# Patient Record
Sex: Female | Born: 1970
Health system: Southern US, Community
[De-identification: ages and names within clinical notes are randomized; demographics above are authoritative.]

## PROBLEM LIST (undated history)

## (undated) DIAGNOSIS — D219 Benign neoplasm of connective and other soft tissue, unspecified: Secondary | ICD-10-CM

## (undated) DIAGNOSIS — N979 Female infertility, unspecified: Secondary | ICD-10-CM

## (undated) DIAGNOSIS — Z8489 Family history of other specified conditions: Secondary | ICD-10-CM

## (undated) DIAGNOSIS — J302 Other seasonal allergic rhinitis: Secondary | ICD-10-CM

## (undated) DIAGNOSIS — E282 Polycystic ovarian syndrome: Secondary | ICD-10-CM

## (undated) DIAGNOSIS — E039 Hypothyroidism, unspecified: Secondary | ICD-10-CM

## (undated) DIAGNOSIS — R319 Hematuria, unspecified: Secondary | ICD-10-CM

## (undated) HISTORY — PX: WISDOM TOOTH EXTRACTION: SHX21

## (undated) HISTORY — DX: Polycystic ovarian syndrome: E28.2

## (undated) HISTORY — PX: NO PAST SURGERIES: SHX2092

## (undated) HISTORY — DX: Hematuria, unspecified: R31.9

## (undated) HISTORY — DX: Other seasonal allergic rhinitis: J30.2

## (undated) HISTORY — DX: Hypothyroidism, unspecified: E03.9

## (undated) HISTORY — DX: Female infertility, unspecified: N97.9

## (undated) HISTORY — DX: Benign neoplasm of connective and other soft tissue, unspecified: D21.9

---

## 2006-04-05 ENCOUNTER — Other Ambulatory Visit: Admission: RE | Admit: 2006-04-05 | Discharge: 2006-04-05 | Payer: Self-pay | Admitting: Gynecology

## 2007-08-03 ENCOUNTER — Encounter: Payer: Self-pay | Admitting: Family Medicine

## 2007-09-25 ENCOUNTER — Encounter (INDEPENDENT_AMBULATORY_CARE_PROVIDER_SITE_OTHER): Payer: Self-pay | Admitting: *Deleted

## 2007-10-04 ENCOUNTER — Other Ambulatory Visit: Admission: RE | Admit: 2007-10-04 | Discharge: 2007-10-04 | Payer: Self-pay | Admitting: Gynecology

## 2007-11-10 ENCOUNTER — Ambulatory Visit: Payer: Self-pay | Admitting: Family Medicine

## 2007-11-10 DIAGNOSIS — E559 Vitamin D deficiency, unspecified: Secondary | ICD-10-CM | POA: Insufficient documentation

## 2007-11-10 DIAGNOSIS — J069 Acute upper respiratory infection, unspecified: Secondary | ICD-10-CM | POA: Insufficient documentation

## 2007-11-10 DIAGNOSIS — E282 Polycystic ovarian syndrome: Secondary | ICD-10-CM | POA: Insufficient documentation

## 2007-11-17 ENCOUNTER — Ambulatory Visit: Payer: Self-pay | Admitting: Family Medicine

## 2007-11-17 LAB — CONVERTED CEMR LAB
Ketones, urine, test strip: NEGATIVE
Nitrite: NEGATIVE
Protein, U semiquant: NEGATIVE
Urobilinogen, UA: NEGATIVE
WBC Urine, dipstick: NEGATIVE

## 2007-11-18 ENCOUNTER — Encounter: Payer: Self-pay | Admitting: Family Medicine

## 2007-11-21 ENCOUNTER — Encounter (INDEPENDENT_AMBULATORY_CARE_PROVIDER_SITE_OTHER): Payer: Self-pay | Admitting: *Deleted

## 2007-11-21 LAB — CONVERTED CEMR LAB
Albumin: 4 g/dL (ref 3.5–5.2)
Basophils Absolute: 0 10*3/uL (ref 0.0–0.1)
Creatinine, Ser: 0.7 mg/dL (ref 0.4–1.2)
Eosinophils Absolute: 0.1 10*3/uL (ref 0.0–0.6)
GFR calc Af Amer: 122 mL/min
HCT: 37.2 % (ref 36.0–46.0)
Hemoglobin: 12.5 g/dL (ref 12.0–15.0)
Lymphocytes Relative: 41.6 % (ref 12.0–46.0)
MCHC: 33.6 g/dL (ref 30.0–36.0)
MCV: 90.7 fL (ref 78.0–100.0)
Monocytes Absolute: 0.3 10*3/uL (ref 0.2–0.7)
Neutro Abs: 2.5 10*3/uL (ref 1.4–7.7)
Neutrophils Relative %: 50.7 % (ref 43.0–77.0)
Potassium: 3.9 meq/L (ref 3.5–5.1)
RDW: 13.1 % (ref 11.5–14.6)
Sodium: 139 meq/L (ref 135–145)
TSH: 1 microintl units/mL (ref 0.35–5.50)
Total Bilirubin: 0.9 mg/dL (ref 0.3–1.2)

## 2007-11-22 LAB — CONVERTED CEMR LAB: Vit D, 1,25-Dihydroxy: 34 (ref 30–89)

## 2009-05-23 ENCOUNTER — Ambulatory Visit: Payer: Self-pay | Admitting: Family Medicine

## 2009-05-23 DIAGNOSIS — J019 Acute sinusitis, unspecified: Secondary | ICD-10-CM

## 2009-05-23 DIAGNOSIS — R319 Hematuria, unspecified: Secondary | ICD-10-CM

## 2009-05-23 LAB — CONVERTED CEMR LAB
Ketones, urine, test strip: NEGATIVE
Nitrite: NEGATIVE
Specific Gravity, Urine: <1.005
WBC Urine, dipstick: NEGATIVE

## 2009-05-24 ENCOUNTER — Encounter: Payer: Self-pay | Admitting: Family Medicine

## 2009-05-27 ENCOUNTER — Encounter (INDEPENDENT_AMBULATORY_CARE_PROVIDER_SITE_OTHER): Payer: Self-pay | Admitting: *Deleted

## 2009-06-06 ENCOUNTER — Ambulatory Visit: Payer: Self-pay | Admitting: Family Medicine

## 2009-06-06 DIAGNOSIS — D492 Neoplasm of unspecified behavior of bone, soft tissue, and skin: Secondary | ICD-10-CM

## 2010-10-19 ENCOUNTER — Encounter: Payer: Self-pay | Admitting: Family Medicine

## 2011-02-26 LAB — HM PAP SMEAR

## 2011-11-24 ENCOUNTER — Ambulatory Visit (INDEPENDENT_AMBULATORY_CARE_PROVIDER_SITE_OTHER): Payer: BC Managed Care – PPO | Admitting: Family Medicine

## 2011-11-24 ENCOUNTER — Encounter: Payer: Self-pay | Admitting: Family Medicine

## 2011-11-24 VITALS — BP 108/70 | HR 60 | Temp 97.6°F | Ht 64.0 in | Wt 132.0 lb

## 2011-11-24 DIAGNOSIS — Z0000 Encounter for general adult medical examination without abnormal findings: Secondary | ICD-10-CM

## 2011-11-24 LAB — HEPATIC FUNCTION PANEL
ALT: 15 U/L (ref 0–35)
Albumin: 4.5 g/dL (ref 3.5–5.2)
Alkaline Phosphatase: 55 U/L (ref 39–117)
Bilirubin, Direct: 0 mg/dL (ref 0.0–0.3)
Total Protein: 7.4 g/dL (ref 6.0–8.3)

## 2011-11-24 LAB — TSH: TSH: 1 u[IU]/mL (ref 0.35–5.50)

## 2011-11-24 LAB — CBC WITH DIFFERENTIAL/PLATELET
Basophils Relative: 0.6 % (ref 0.0–3.0)
Eosinophils Absolute: 0 10*3/uL (ref 0.0–0.7)
Eosinophils Relative: 0 % (ref 0.0–5.0)
Hemoglobin: 12.1 g/dL (ref 12.0–15.0)
Lymphocytes Relative: 46.2 % — ABNORMAL HIGH (ref 12.0–46.0)
MCHC: 32.9 g/dL (ref 30.0–36.0)
MCV: 90.8 fl (ref 78.0–100.0)
Neutro Abs: 2.5 10*3/uL (ref 1.4–7.7)
Neutrophils Relative %: 47.2 % (ref 43.0–77.0)
RBC: 4.04 Mil/uL (ref 3.87–5.11)
WBC: 5.3 10*3/uL (ref 4.5–10.5)

## 2011-11-24 LAB — BASIC METABOLIC PANEL
CO2: 28 mEq/L (ref 19–32)
Calcium: 9.5 mg/dL (ref 8.4–10.5)
Chloride: 104 mEq/L (ref 96–112)
Sodium: 138 mEq/L (ref 135–145)

## 2011-11-24 LAB — LIPID PANEL: HDL: 88 mg/dL (ref >39.00)

## 2011-11-24 LAB — POCT URINALYSIS DIPSTICK
Bilirubin, UA: NEGATIVE
Glucose, UA: NEGATIVE
Ketones, UA: NEGATIVE
Leukocytes, UA: NEGATIVE
Protein, UA: NEGATIVE
Spec Grav, UA: <=1.005

## 2011-11-24 LAB — LDL CHOLESTEROL, DIRECT: Direct LDL: 109.3 mg/dL

## 2011-11-24 NOTE — Patient Instructions (Signed)

## 2011-11-24 NOTE — Progress Notes (Signed)
Addended by: Arnette Norris on: 11/24/2011 10:16 AM   Modules accepted: Orders

## 2011-11-24 NOTE — Progress Notes (Signed)
  Subjective:     Kelly Parsons is a 41 y.o. female and is here for a comprehensive physical exam. The patient reports no problems.  History   Social History  . Marital Status: Married    Spouse Name: N/A    Number of Children: 0  . Years of Education: N/A   Occupational History  . private Engineer, agricultural   . substitute teacher    Social History Main Topics  . Smoking status: Never Smoker   . Smokeless tobacco: Never Used  . Alcohol Use: No  . Drug Use: No  . Sexually Active: Yes -- Female partner(s)   Other Topics Concern  . Not on file   Social History Narrative   Exercise-- 2 days a week   Health Maintenance  Topic Date Due  . Mammogram  03/23/2012  . Influenza Vaccine  06/27/2012  . Pap Smear  02/25/2014  . Tetanus/tdap  11/09/2017    The following portions of the patient's history were reviewed and updated as appropriate: allergies, current medications, past family history, past medical history, past social history, past surgical history and problem list.  Review of Systems Review of Systems  Constitutional: Negative for activity change, appetite change and fatigue.  HENT: Negative for hearing loss, congestion, tinnitus and ear discharge.  dentist q31m Eyes: Negative for visual disturbance ( optho due- vision corrected to 20/20 with glasses).  Respiratory: Negative for cough, chest tightness and shortness of breath.   Cardiovascular: Negative for chest pain, palpitations and leg swelling.  Gastrointestinal: Negative for abdominal pain, diarrhea, constipation and abdominal distention.  Genitourinary: Negative for urgency, frequency, decreased urine volume and difficulty urinating.  Musculoskeletal: Negative for back pain, arthralgias and gait problem.  Skin: Negative for color change, pallor and rash.  Neurological: Negative for dizziness, light-headedness, numbness and headaches.  Hematological: Negative for adenopathy. Does not bruise/bleed easily.    Psychiatric/Behavioral: Negative for suicidal ideas, confusion, sleep disturbance, self-injury, dysphoric mood, decreased concentration and agitation.       Objective:    BP 108/70  Pulse 60  Temp(Src) 97.6 F (36.4 C) (Oral)  Ht 5\' 4"  (1.626 m)  Wt 132 lb (59.875 kg)  BMI 22.66 kg/m2  SpO2 99% General appearance: alert, cooperative, appears stated age and no distress Head: Normocephalic, without obvious abnormality, atraumatic Eyes: conjunctivae/corneas clear. PERRL, EOM's intact. Fundi benign. Ears: normal TM's and external ear canals both ears Nose: Nares normal. Septum midline. Mucosa normal. No drainage or sinus tenderness. Throat: lips, mucosa, and tongue normal; teeth and gums normal Neck: no adenopathy, no carotid bruit, no JVD, supple, symmetrical, trachea midline and thyroid not enlarged, symmetric, no tenderness/mass/nodules Back: symmetric, no curvature. ROM normal. No CVA tenderness. Lungs: clear to auscultation bilaterally Breasts: gyn Heart: regular rate and rhythm, S1, S2 normal, no murmur, click, rub or gallop Abdomen: soft, non-tender; bowel sounds normal; no masses,  no organomegaly Pelvic: gyn Extremities: extremities normal, atraumatic, no cyanosis or edema Pulses: 2+ and symmetric Skin: Skin color, texture, turgor normal. No rashes or lesions Lymph nodes: Cervical, supraclavicular, and axillary nodes normal. Neurologic: Alert and oriented X 3, normal strength and tone. Normal symmetric reflexes. Normal coordination and gait psych-- no depression, no anxiety    Assessment:    Healthy female exam.       Plan:    check labs  ghm utd See After Visit Summary for Counseling Recommendations

## 2012-11-11 ENCOUNTER — Other Ambulatory Visit: Payer: Self-pay

## 2013-08-02 ENCOUNTER — Other Ambulatory Visit: Payer: Self-pay

## 2014-07-12 ENCOUNTER — Other Ambulatory Visit: Payer: Self-pay

## 2014-09-27 LAB — HM MAMMOGRAPHY: HM MAMMO: NORMAL

## 2014-09-27 LAB — HM PAP SMEAR: HM Pap smear: NORMAL

## 2015-05-30 ENCOUNTER — Encounter (HOSPITAL_COMMUNITY): Payer: Self-pay | Admitting: Emergency Medicine

## 2015-05-30 ENCOUNTER — Emergency Department (HOSPITAL_COMMUNITY)
Admission: EM | Admit: 2015-05-30 | Discharge: 2015-05-30 | Disposition: A | Discharge status: Home / Self Care | Payer: 59 | Attending: Emergency Medicine | Admitting: Emergency Medicine

## 2015-05-30 ENCOUNTER — Emergency Department (HOSPITAL_COMMUNITY): Payer: 59

## 2015-05-30 DIAGNOSIS — S299XXA Unspecified injury of thorax, initial encounter: Secondary | ICD-10-CM | POA: Diagnosis present

## 2015-05-30 DIAGNOSIS — Z8639 Personal history of other endocrine, nutritional and metabolic disease: Secondary | ICD-10-CM | POA: Diagnosis not present

## 2015-05-30 DIAGNOSIS — Y998 Other external cause status: Secondary | ICD-10-CM | POA: Diagnosis not present

## 2015-05-30 DIAGNOSIS — Y9289 Other specified places as the place of occurrence of the external cause: Secondary | ICD-10-CM | POA: Diagnosis not present

## 2015-05-30 DIAGNOSIS — S6991XA Unspecified injury of right wrist, hand and finger(s), initial encounter: Secondary | ICD-10-CM | POA: Diagnosis not present

## 2015-05-30 DIAGNOSIS — Y9389 Activity, other specified: Secondary | ICD-10-CM | POA: Diagnosis not present

## 2015-05-30 DIAGNOSIS — W1789XA Other fall from one level to another, initial encounter: Secondary | ICD-10-CM | POA: Insufficient documentation

## 2015-05-30 DIAGNOSIS — R0781 Pleurodynia: Secondary | ICD-10-CM

## 2015-05-30 DIAGNOSIS — M79641 Pain in right hand: Secondary | ICD-10-CM

## 2015-05-30 MED ORDER — HYDROCODONE-ACETAMINOPHEN 5-325 MG PO TABS: 1.0000 | ORAL_TABLET | ORAL | Status: DC | PRN

## 2015-05-30 MED ORDER — ONDANSETRON 4 MG PO TBDP
4.0000 mg | ORAL_TABLET | Freq: Once | ORAL | Status: AC
  Administered 2015-05-30: 4 mg via ORAL
  Filled 2015-05-30: qty 1

## 2015-05-30 MED ORDER — OXYCODONE-ACETAMINOPHEN 5-325 MG PO TABS
2.0000 | ORAL_TABLET | Freq: Once | ORAL | Status: AC
  Administered 2015-05-30: 2 via ORAL
  Filled 2015-05-30: qty 2

## 2015-05-30 NOTE — ED Notes (Signed)
Mechanical fall from step stool this morning around 0650.   Patient states fell on R side.   Complains of R hand, R arm, R rib pain.   Patient states "I can't decide if my shoulder is hurt or not".

## 2015-05-30 NOTE — Discharge Instructions (Signed)
You were evaluated in the ED today after fall. It appears she may have suffered from a mild rib fracture. It is important for you to continue breathing deeply, coughing as needed. You'll be given pain medicines for severe pain. Continue to take Motrin or Tylenol for mild to moderate pain. Follow-up with your PCP in 10 days for reevaluation. Return to ED for worsening symptoms.

## 2015-05-30 NOTE — ED Provider Notes (Signed)
CSN: [id number]     Arrival date & time 05/30/15  0714 History   First MD Initiated Contact with Patient 05/30/15 [phone]     Chief Complaint  Patient presents with  . Fall     (Consider location/radiation/quality/duration/timing/severity/associated sxs/prior Treatment) HPI Kelly Parsons is a 44 y.o. female who comes in for evaluation after fall. Patient states approximately 7:00 this morning, she was standing on a step stool approximately 3 feet tall when she lost her balance, falling on her right side. She believes the stool may have hit her on her right ribs. She reports pain to her right ribs and right hand. She denies any head trauma, loss of consciousness, chest pain, shortness of breath, abdominal pain, nausea or vomiting, headaches, vision changes, weaknesses. She reports deep respiration exacerbates her right rib pain, leaning forward improved her discomfort. She rates her overall discomfort as a 6/10. Patient also reports that her blood pressure is usually low.  Past Medical History  Diagnosis Date  . PCOS (polycystic ovarian syndrome)    History reviewed. No pertinent past surgical history. Family History  Problem Relation Age of Onset  . Kidney disease Mother   . Kidney cancer Mother   . Cancer Mother     renal  . Colon cancer Maternal Grandfather   . Heart attack Paternal Grandfather 27  . Heart disease Paternal Grandfather 94    MI  . Hypothyroidism Father   . Hyperlipidemia Father   . Diabetes Maternal Aunt   . Diabetes Maternal Uncle   . Diabetes Maternal Grandmother   . Alcohol abuse Paternal Grandmother   . Diabetes Maternal Aunt   . Diabetes Maternal Uncle    Social History  Substance Use Topics  . Smoking status: Never Smoker   . Smokeless tobacco: Never Used  . Alcohol Use: No   OB History    No data available     Review of Systems A 10 point review of systems was completed and was negative except for pertinent positives and negatives as mentioned in  the history of present illness     Allergies  Review of patient's allergies indicates no known allergies.  Home Medications   Prior to Admission medications   Medication Sig Start Date End Date Taking? Authorizing Provider  HYDROcodone-acetaminophen (NORCO) 5-325 MG per tablet Take 1-2 tablets by mouth every 4 (four) hours as needed. 05/30/15   Comer Locket, PA-C   BP 99/47 mmHg  Pulse 64  Temp(Src) 98 F (36.7 C) (Oral)  Resp 18  SpO2 98%  LMP 05/18/2015 Physical Exam  Constitutional: She is oriented to person, place, and time. She appears well-developed and well-nourished.  HENT:  Head: Normocephalic and atraumatic.  Mouth/Throat: Oropharynx is clear and moist.  Eyes: Conjunctivae are normal. Pupils are equal, round, and reactive to light. Right eye exhibits no discharge. Left eye exhibits no discharge. No scleral icterus.  Neck: Normal range of motion. Neck supple.  Cardiovascular: Normal rate, regular rhythm and normal heart sounds.   Pulmonary/Chest: Effort normal and breath sounds normal. No respiratory distress. She has no wheezes. She has no rales.  Tenderness diffusely throughout inferior ribs along the right axillary line. No obvious bony deformities. No lesions, ecchymosis noted.  Abdominal: Soft. She exhibits no distension and no mass. There is no tenderness. There is no rebound and no guarding.  Musculoskeletal: She exhibits no tenderness.  Full active range of motion of all 4 extremities. Tenderness to palpation along the inferior aspect of right scapula. No  crepitus. Mild tenderness to palpation to lateral aspect of right fifth metacarpal and diffusely to ulnar head.  Neurological: She is alert and oriented to person, place, and time.  Cranial Nerves II-XII grossly intact. Moves all extremities without ataxia. Motor and sensation 5/5.  Skin: Skin is warm and dry. No rash noted.  Psychiatric: She has a normal mood and affect.  Nursing note and vitals  reviewed.   ED Course  Procedures (including critical care time) Labs Review Labs Reviewed - No data to display  Imaging Review Dg Chest 2 View  05/30/2015   CLINICAL DATA:  Right rib pain.  Fall.  EXAM: CHEST  2 VIEW  COMPARISON:  None.  FINDINGS: Both lungs are clear. Negative for a pneumothorax. Heart and mediastinum are within normal limits. The trachea is midline. No large pleural effusions. There is concern for mildly displaced right rib fractures involving the right third and fourth ribs.  IMPRESSION: Concern for mildly displaced right rib fractures. This could be further characterized with dedicated rib images.  Negative for pneumothorax.   Electronically Signed   By: Markus Daft M.D.   On: 05/30/2015 08:09   Dg Hand Complete Right  05/30/2015   CLINICAL DATA:  Right hand and right wrist pain status post fall.  EXAM: RIGHT HAND - COMPLETE 3+ VIEW  COMPARISON:  None.  FINDINGS: There is no evidence of fracture or dislocation. There is no evidence of arthropathy or other focal bone abnormality. Soft tissues are unremarkable. Second finger ring is noted.  IMPRESSION: No acute osseous abnormality identified.   Electronically Signed   By: Fidela Salisbury M.D.   On: 05/30/2015 08:08   I have personally reviewed and evaluated these images and lab results as part of my medical decision-making.   EKG Interpretation None     Meds given in ED:  Medications  oxyCODONE-acetaminophen (PERCOCET/ROXICET) 5-325 MG per tablet 2 tablet (2 tablets Oral Given 05/30/15 0742)  ondansetron (ZOFRAN-ODT) disintegrating tablet 4 mg (4 mg Oral Given 05/30/15 0742)    New Prescriptions   HYDROCODONE-ACETAMINOPHEN (NORCO) 5-325 MG PER TABLET    Take 1-2 tablets by mouth every 4 (four) hours as needed.   Filed Vitals:   05/30/15 0726  BP: 99/47  Pulse: 64  Temp: 98 F (36.7 C)  TempSrc: Oral  Resp: 18  SpO2: 98%    MDM  Vitals stable -afebrile Pt resting comfortably in ED. PE--normal lung exam,  normal neuro exam. Imaging--plain films of right hand negative. Chest x-ray shows concern for mildly displaced right rib fractures, no pneumothorax.  DDX--patient will be treated with pain medicines at home, given instructions on deep respirations to prevent pneumonia. Follow-up with PCP in 10 days for reevaluation. No evidence of other acute or emergent pathology at this time. Patient appears well, in good condition and is appropriate for discharge. Discharged in the care of her husband, who agrees with plan  I discussed all relevant lab findings and imaging results with pt and they verbalized understanding. Discussed f/u with PCP and return precautions, pt very amenable to plan.  Final diagnoses:  Rib pain on right side  Hand pain, right        Comer Locket, PA-C 05/30/15 [id number]  Pattricia Boss, MD 05/31/15 1214

## 2015-09-08 ENCOUNTER — Encounter: Payer: Self-pay | Admitting: Behavioral Health

## 2015-09-08 ENCOUNTER — Telehealth: Payer: Self-pay | Admitting: Behavioral Health

## 2015-09-08 NOTE — Telephone Encounter (Signed)
Pre-Visit Call completed with patient and chart updated.   Pre-Visit Info documented in Specialty Comments under [employer].    

## 2015-09-09 ENCOUNTER — Encounter: Payer: Self-pay | Admitting: Family Medicine

## 2015-09-09 ENCOUNTER — Ambulatory Visit (INDEPENDENT_AMBULATORY_CARE_PROVIDER_SITE_OTHER): Payer: 59 | Admitting: Family Medicine

## 2015-09-09 VITALS — BP 92/60 | HR 57 | Temp 98.0°F | Ht 64.75 in | Wt 126.6 lb

## 2015-09-09 DIAGNOSIS — Z0000 Encounter for general adult medical examination without abnormal findings: Secondary | ICD-10-CM

## 2015-09-09 DIAGNOSIS — E559 Vitamin D deficiency, unspecified: Secondary | ICD-10-CM

## 2015-09-09 DIAGNOSIS — R319 Hematuria, unspecified: Secondary | ICD-10-CM

## 2015-09-09 DIAGNOSIS — E032 Hypothyroidism due to medicaments and other exogenous substances: Secondary | ICD-10-CM

## 2015-09-09 LAB — CBC WITH DIFFERENTIAL/PLATELET
BASOS PCT: 0.4 % (ref 0.0–3.0)
Basophils Absolute: 0 10*3/uL (ref 0.0–0.1)
EOS ABS: 0.1 10*3/uL (ref 0.0–0.7)
EOS PCT: 1.1 % (ref 0.0–5.0)
HEMATOCRIT: 38 % (ref 36.0–46.0)
HEMOGLOBIN: 12.5 g/dL (ref 12.0–15.0)
LYMPHS PCT: 33.9 % (ref 12.0–46.0)
Lymphs Abs: 2.5 10*3/uL (ref 0.7–4.0)
MCHC: 32.9 g/dL (ref 30.0–36.0)
MCV: 92.4 fl (ref 78.0–100.0)
Monocytes Absolute: 0.3 10*3/uL (ref 0.1–1.0)
Monocytes Relative: 4.8 % (ref 3.0–12.0)
Neutro Abs: 4.3 10*3/uL (ref 1.4–7.7)
Neutrophils Relative %: 59.8 % (ref 43.0–77.0)
Platelets: 225 10*3/uL (ref 150.0–400.0)
RBC: 4.11 Mil/uL (ref 3.87–5.11)
RDW: 13 % (ref 11.5–15.5)
WBC: 7.3 10*3/uL (ref 4.0–10.5)

## 2015-09-09 LAB — VITAMIN D 25 HYDROXY (VIT D DEFICIENCY, FRACTURES): VITD: 44.05 ng/mL (ref 30.00–100.00)

## 2015-09-09 LAB — THYROID PANEL WITH TSH
Free Thyroxine Index: 2.1 (ref 1.4–3.8)
T3 Uptake: 31 % (ref 22–35)
T4 TOTAL: 6.9 ug/dL (ref 4.5–12.0)
TSH: 0.662 u[IU]/mL (ref 0.350–4.500)

## 2015-09-09 LAB — COMPREHENSIVE METABOLIC PANEL
ALBUMIN: 4.3 g/dL (ref 3.5–5.2)
ALK PHOS: 63 U/L (ref 39–117)
ALT: 13 U/L (ref 0–35)
AST: 14 U/L (ref 0–37)
BUN: 16 mg/dL (ref 6–23)
CO2: 29 mEq/L (ref 19–32)
CREATININE: 0.63 mg/dL (ref 0.40–1.20)
Calcium: 9.8 mg/dL (ref 8.4–10.5)
Chloride: 101 mEq/L (ref 96–112)
GFR: 108.81 mL/min (ref >60.00)
GLUCOSE: 91 mg/dL (ref 70–99)
Potassium: 3.8 mEq/L (ref 3.5–5.1)
SODIUM: 137 meq/L (ref 135–145)
TOTAL PROTEIN: 6.9 g/dL (ref 6.0–8.3)
Total Bilirubin: 0.5 mg/dL (ref 0.2–1.2)

## 2015-09-09 LAB — POCT URINALYSIS DIPSTICK
Bilirubin, UA: NEGATIVE
Glucose, UA: NEGATIVE
Ketones, UA: NEGATIVE
LEUKOCYTES UA: NEGATIVE
Nitrite, UA: NEGATIVE
PH UA: 6
PROTEIN UA: NEGATIVE
SPEC GRAV UA: 1.01
UROBILINOGEN UA: 0.2

## 2015-09-09 LAB — LIPID PANEL
CHOLESTEROL: 199 mg/dL (ref 0–200)
HDL: 78 mg/dL (ref >39.00)
LDL CALC: 105 mg/dL — AB (ref 0–99)
NONHDL: 120.69
Total CHOL/HDL Ratio: 3
Triglycerides: 80 mg/dL (ref 0.0–149.0)
VLDL: 16 mg/dL (ref 0.0–40.0)

## 2015-09-09 MED ORDER — THYROID 48.75 MG PO TABS: 1.0000 | ORAL_TABLET | Freq: Every morning | ORAL | Status: DC

## 2015-09-09 NOTE — Progress Notes (Signed)
Subjective:     Kelly Parsons is a 44 y.o. female and is here for a comprehensive physical exam. The patient reports no problems.  Social History   Social History  . Marital Status: Married    Spouse Name: N/A  . Number of Children: 0  . Years of Education: N/A   Occupational History  . 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 Smoking status: Never Smoker   . Smokeless tobacco: Never Used  . Alcohol Use: No  . Drug Use: No  . Sexual Activity:    Partners: Male   Other Topics Concern  . Not on file   Social History Narrative   Exercise-- 2 days a week   Health Maintenance  Topic Date Due  . INFLUENZA VACCINE  09/08/2016 (Originally 04/28/2015)  . HIV Screening  09/08/2016 (Originally [birth year]04/1986)  . MAMMOGRAM  09/28/2015  . PAP SMEAR  09/27/2017  . TETANUS/TDAP  11/09/2017    The following portions of the patient's history were reviewed and updated as appropriate:  She  has a past medical history of PCOS (polycystic ovarian syndrome); Hypothyroidism; and Hematuria. She  does not have any pertinent problems on file. She  has no past surgical history on file. Her family history includes Alcohol abuse in her paternal grandmother; Breast cancer in her mother; Colon cancer in her maternal grandfather; Diabetes in her maternal aunt, maternal aunt, maternal grandmother, maternal uncle, and maternal uncle; Heart attack (age of onset: 39) in her paternal grandfather; Heart disease (age of onset: 56) in her paternal grandfather; Hyperlipidemia in her father; Hypothyroidism in her father; Kidney cancer in her mother; Kidney disease in her mother. She  reports that she has never smoked. She has never used smokeless tobacco. She reports that she does not drink alcohol or use illicit drugs. She has a current medication list which includes the following prescription(s): thyroid. No current outpatient prescriptions on file prior to visit.   No current  facility-administered medications on file prior to visit.   She has No Known Allergies..  Review of Systems Review of Systems  Constitutional: Negative for activity change, appetite change and fatigue.  HENT: Negative for hearing loss, congestion, tinnitus and ear discharge.  dentist q28mEyes: Negative for visual disturbance (see optho q1y -- vision corrected to 20/20 with glasses).  Respiratory: Negative for cough, chest tightness and shortness of breath.   Cardiovascular: Negative for chest pain, palpitations and leg swelling.  Gastrointestinal: Negative for abdominal pain, diarrhea, constipation and abdominal distention.  Genitourinary: Negative for urgency, frequency, decreased urine volume and difficulty urinating.  Musculoskeletal: Negative for back pain, arthralgias and gait problem.  Skin: Negative for color change, pallor and rash.  Neurological: Negative for dizziness, light-headedness, numbness and headaches.  Hematological: Negative for adenopathy. Does not bruise/bleed easily.  Psychiatric/Behavioral: Negative for suicidal ideas, confusion, sleep disturbance, self-injury, dysphoric mood, decreased concentration and agitation.       Objective:    BP 92/60 mmHg  Pulse 57  Temp(Src) 98 F (36.7 C) (Oral)  Ht 5' 4.75" (1.645 m)  Wt 126 lb 9.6 oz (57.425 kg)  BMI 21.22 kg/m2  SpO2 99%  LMP 08/31/2015 General appearance: alert, cooperative, appears stated age and no distress Head: Normocephalic, without obvious abnormality, atraumatic Eyes: conjunctivae/corneas clear. PERRL, EOM's intact. Fundi benign. Ears: normal TM's and external ear canals both ears Nose: Nares normal. Septum midline. Mucosa normal. No drainage or sinus tenderness. Throat: lips, mucosa, and  tongue normal; teeth and gums normal Neck: no adenopathy, no carotid bruit, no JVD, supple, symmetrical, trachea midline and thyroid not enlarged, symmetric, no tenderness/mass/nodules Back: symmetric, no  curvature. ROM normal. No CVA tenderness. Lungs: clear to auscultation bilaterally Breasts: normal appearance, no masses or tenderness Heart: regular rate and rhythm, S1, S2 normal, no murmur, click, rub or gallop Abdomen: soft, non-tender; bowel sounds normal; no masses,  no organomegaly Pelvic: deferred -- gyn Extremities: extremities normal, atraumatic, no cyanosis or edema Pulses: 2+ and symmetric Skin: Skin color, texture, turgor normal. No rashes or lesions Lymph nodes: Cervical, supraclavicular, and axillary nodes normal. Neurologic: Alert and oriented X 3, normal strength and tone. Normal symmetric reflexes. Normal coordination and gait Psych- no depression, no anxiety      Assessment:    Healthy female exam.      Plan:    ghm utd Check labs Flu refused See After Visit Summary for Counseling Recommendations   1. Hypothyroidism due to medication   - Thyroid (NATURE-THROID) 48.75 MG TABS; Take 1 tablet by mouth every morning.  Dispense: 90 tablet; Refill: 1 - Thyroid Panel With TSH  2. Vitamin D deficiency   - Vitamin D (25 hydroxy)  3. Preventative health care   - Comp Met (CMET) - Lipid panel - CBC with Differential/Platelet - POCT urinalysis dipstick

## 2015-09-09 NOTE — Progress Notes (Signed)
Pre visit review using our clinic review tool, if applicable. No additional management support is needed unless otherwise documented below in the visit note. 

## 2015-09-09 NOTE — Patient Instructions (Signed)
Preventive Care for Adults, Female A healthy lifestyle and preventive care can promote health and wellness. Preventive health guidelines for women include the following key practices.  A routine yearly physical is a good way to check with your health care provider about your health and preventive screening. It is a chance to share any concerns and updates on your health and to receive a thorough exam.  Visit your dentist for a routine exam and preventive care every 6 months. Brush your teeth twice a day and floss once a day. Good oral hygiene prevents tooth decay and gum disease.  The frequency of eye exams is based on your age, health, family medical history, use of contact lenses, and other factors. Follow your health care provider's recommendations for frequency of eye exams.  Eat a healthy diet. Foods like vegetables, fruits, whole grains, low-fat dairy products, and lean protein foods contain the nutrients you need without too many calories. Decrease your intake of foods high in solid fats, added sugars, and salt. Eat the right amount of calories for you.Get information about a proper diet from your health care provider, if necessary.  Regular physical exercise is one of the most important things you can do for your health. Most adults should get at least 150 minutes of moderate-intensity exercise (any activity that increases your heart rate and causes you to sweat) each week. In addition, most adults need muscle-strengthening exercises on 2 or more days a week.  Maintain a healthy weight. The body mass index (BMI) is a screening tool to identify possible weight problems. It provides an estimate of body fat based on height and weight. Your health care provider can find your BMI and can help you achieve or maintain a healthy weight.For adults 20 years and older:  A BMI below 18.5 is considered underweight.  A BMI of 18.5 to 24.9 is normal.  A BMI of 25 to 29.9 is considered overweight.  A  BMI of 30 and above is considered obese.  Maintain normal blood lipids and cholesterol levels by exercising and minimizing your intake of saturated fat. Eat a balanced diet with plenty of fruit and vegetables. Blood tests for lipids and cholesterol should begin at age 45 and be repeated every 5 years. If your lipid or cholesterol levels are high, you are over 50, or you are at high risk for heart disease, you may need your cholesterol levels checked more frequently.Ongoing high lipid and cholesterol levels should be treated with medicines if diet and exercise are not working.  If you smoke, find out from your health care provider how to quit. If you do not use tobacco, do not start.  Lung cancer screening is recommended for adults aged 45-80 years who are at high risk for developing lung cancer because of a history of smoking. A yearly low-dose CT scan of the lungs is recommended for people who have at least a 30-pack-year history of smoking and are a current smoker or have quit within the past 15 years. A pack year of smoking is smoking an average of 1 pack of cigarettes a day for 1 year (for example: 1 pack a day for 30 years or 2 packs a day for 15 years). Yearly screening should continue until the smoker has stopped smoking for at least 15 years. Yearly screening should be stopped for people who develop a health problem that would prevent them from having lung cancer treatment.  If you are pregnant, do not drink alcohol. If you are  breastfeeding, be very cautious about drinking alcohol. If you are not pregnant and choose to drink alcohol, do not have more than 1 drink per day. One drink is considered to be 12 ounces (355 mL) of beer, 5 ounces (148 mL) of wine, or 1.5 ounces (44 mL) of liquor.  Avoid use of street drugs. Do not share needles with anyone. Ask for help if you need support or instructions about stopping the use of drugs.  High blood pressure causes heart disease and increases the risk  of stroke. Your blood pressure should be checked at least every 1 to 2 years. Ongoing high blood pressure should be treated with medicines if weight loss and exercise do not work.  If you are 55-79 years old, ask your health care provider if you should take aspirin to prevent strokes.  Diabetes screening is done by taking a blood sample to check your blood glucose level after you have not eaten for a certain period of time (fasting). If you are not overweight and you do not have risk factors for diabetes, you should be screened once every 3 years starting at age 45. If you are overweight or obese and you are 40-70 years of age, you should be screened for diabetes every year as part of your cardiovascular risk assessment.  Breast cancer screening is essential preventive care for women. You should practice "breast self-awareness." This means understanding the normal appearance and feel of your breasts and may include breast self-examination. Any changes detected, no matter how small, should be reported to a health care provider. Women in their 20s and 30s should have a clinical breast exam (CBE) by a health care provider as part of a regular health exam every 1 to 3 years. After age 40, women should have a CBE every year. Starting at age 40, women should consider having a mammogram (breast X-ray test) every year. Women who have a family history of breast cancer should talk to their health care provider about genetic screening. Women at a high risk of breast cancer should talk to their health care providers about having an MRI and a mammogram every year.  Breast cancer gene (BRCA)-related cancer risk assessment is recommended for women who have family members with BRCA-related cancers. BRCA-related cancers include breast, ovarian, tubal, and peritoneal cancers. Having family members with these cancers may be associated with an increased risk for harmful changes (mutations) in the breast cancer genes BRCA1 and  BRCA2. Results of the assessment will determine the need for genetic counseling and BRCA1 and BRCA2 testing.  Your health care provider may recommend that you be screened regularly for cancer of the pelvic organs (ovaries, uterus, and vagina). This screening involves a pelvic examination, including checking for microscopic changes to the surface of your cervix (Pap test). You may be encouraged to have this screening done every 3 years, beginning at age 21.  For women ages 30-65, health care providers may recommend pelvic exams and Pap testing every 3 years, or they may recommend the Pap and pelvic exam, combined with testing for human papilloma virus (HPV), every 5 years. Some types of HPV increase your risk of cervical cancer. Testing for HPV may also be done on women of any age with unclear Pap test results.  Other health care providers may not recommend any screening for nonpregnant women who are considered low risk for pelvic cancer and who do not have symptoms. Ask your health care provider if a screening pelvic exam is right for   you.  If you have had past treatment for cervical cancer or a condition that could lead to cancer, you need Pap tests and screening for cancer for at least 20 years after your treatment. If Pap tests have been discontinued, your risk factors (such as having a new sexual partner) need to be reassessed to determine if screening should resume. Some women have medical problems that increase the chance of getting cervical cancer. In these cases, your health care provider may recommend more frequent screening and Pap tests.  Colorectal cancer can be detected and often prevented. Most routine colorectal cancer screening begins at the age of 50 years and continues through age 75 years. However, your health care provider may recommend screening at an earlier age if you have risk factors for colon cancer. On a yearly basis, your health care provider may provide home test kits to check  for hidden blood in the stool. Use of a small camera at the end of a tube, to directly examine the colon (sigmoidoscopy or colonoscopy), can detect the earliest forms of colorectal cancer. Talk to your health care provider about this at age 50, when routine screening begins. Direct exam of the colon should be repeated every 5-10 years through age 75 years, unless early forms of precancerous polyps or small growths are found.  People who are at an increased risk for hepatitis B should be screened for this virus. You are considered at high risk for hepatitis B if:  You were born in a country where hepatitis B occurs often. Talk with your health care provider about which countries are considered high risk.  Your parents were born in a high-risk country and you have not received a shot to protect against hepatitis B (hepatitis B vaccine).  You have HIV or AIDS.  You use needles to inject street drugs.  You live with, or have sex with, someone who has hepatitis B.  You get hemodialysis treatment.  You take certain medicines for conditions like cancer, organ transplantation, and autoimmune conditions.  Hepatitis C blood testing is recommended for all people born from 1945 through 1965 and any individual with known risks for hepatitis C.  Practice safe sex. Use condoms and avoid high-risk sexual practices to reduce the spread of sexually transmitted infections (STIs). STIs include gonorrhea, chlamydia, syphilis, trichomonas, herpes, HPV, and human immunodeficiency virus (HIV). Herpes, HIV, and HPV are viral illnesses that have no cure. They can result in disability, cancer, and death.  You should be screened for sexually transmitted illnesses (STIs) including gonorrhea and chlamydia if:  You are sexually active and are younger than 24 years.  You are older than 24 years and your health care provider tells you that you are at risk for this type of infection.  Your sexual activity has changed  since you were last screened and you are at an increased risk for chlamydia or gonorrhea. Ask your health care provider if you are at risk.  If you are at risk of being infected with HIV, it is recommended that you take a prescription medicine daily to prevent HIV infection. This is called preexposure prophylaxis (PrEP). You are considered at risk if:  You are sexually active and do not regularly use condoms or know the HIV status of your partner(s).  You take drugs by injection.  You are sexually active with a partner who has HIV.  Talk with your health care provider about whether you are at high risk of being infected with HIV. If   you choose to begin PrEP, you should first be tested for HIV. You should then be tested every 3 months for as long as you are taking PrEP.  Osteoporosis is a disease in which the bones lose minerals and strength with aging. This can result in serious bone fractures or breaks. The risk of osteoporosis can be identified using a bone density scan. Women ages 67 years and over and women at risk for fractures or osteoporosis should discuss screening with their health care providers. Ask your health care provider whether you should take a calcium supplement or vitamin D to reduce the rate of osteoporosis.  Menopause can be associated with physical symptoms and risks. Hormone replacement therapy is available to decrease symptoms and risks. You should talk to your health care provider about whether hormone replacement therapy is right for you.  Use sunscreen. Apply sunscreen liberally and repeatedly throughout the day. You should seek shade when your shadow is shorter than you. Protect yourself by wearing long sleeves, pants, a wide-brimmed hat, and sunglasses year round, whenever you are outdoors.  Once a month, do a whole body skin exam, using a mirror to look at the skin on your back. Tell your health care provider of new moles, moles that have irregular borders, moles that  are larger than a pencil eraser, or moles that have changed in shape or color.  Stay current with required vaccines (immunizations).  Influenza vaccine. All adults should be immunized every year.  Tetanus, diphtheria, and acellular pertussis (Td, Tdap) vaccine. Pregnant women should receive 1 dose of Tdap vaccine during each pregnancy. The dose should be obtained regardless of the length of time since the last dose. Immunization is preferred during the 27th-36th week of gestation. An adult who has not previously received Tdap or who does not know her vaccine status should receive 1 dose of Tdap. This initial dose should be followed by tetanus and diphtheria toxoids (Td) booster doses every 10 years. Adults with an unknown or incomplete history of completing a 3-dose immunization series with Td-containing vaccines should begin or complete a primary immunization series including a Tdap dose. Adults should receive a Td booster every 10 years.  Varicella vaccine. An adult without evidence of immunity to varicella should receive 2 doses or a second dose if she has previously received 1 dose. Pregnant females who do not have evidence of immunity should receive the first dose after pregnancy. This first dose should be obtained before leaving the health care facility. The second dose should be obtained 4-8 weeks after the first dose.  Human papillomavirus (HPV) vaccine. Females aged 13-26 years who have not received the vaccine previously should obtain the 3-dose series. The vaccine is not recommended for use in pregnant females. However, pregnancy testing is not needed before receiving a dose. If a female is found to be pregnant after receiving a dose, no treatment is needed. In that case, the remaining doses should be delayed until after the pregnancy. Immunization is recommended for any person with an immunocompromised condition through the age of 61 years if she did not get any or all doses earlier. During the  3-dose series, the second dose should be obtained 4-8 weeks after the first dose. The third dose should be obtained 24 weeks after the first dose and 16 weeks after the second dose.  Zoster vaccine. One dose is recommended for adults aged 30 years or older unless certain conditions are present.  Measles, mumps, and rubella (MMR) vaccine. Adults born  before 1957 generally are considered immune to measles and mumps. Adults born in 1957 or later should have 1 or more doses of MMR vaccine unless there is a contraindication to the vaccine or there is laboratory evidence of immunity to each of the three diseases. A routine second dose of MMR vaccine should be obtained at least 28 days after the first dose for students attending postsecondary schools, health care workers, or international travelers. People who received inactivated measles vaccine or an unknown type of measles vaccine during 1963-1967 should receive 2 doses of MMR vaccine. People who received inactivated mumps vaccine or an unknown type of mumps vaccine before 1979 and are at high risk for mumps infection should consider immunization with 2 doses of MMR vaccine. For females of childbearing age, rubella immunity should be determined. If there is no evidence of immunity, females who are not pregnant should be vaccinated. If there is no evidence of immunity, females who are pregnant should delay immunization until after pregnancy. Unvaccinated health care workers born before 1957 who lack laboratory evidence of measles, mumps, or rubella immunity or laboratory confirmation of disease should consider measles and mumps immunization with 2 doses of MMR vaccine or rubella immunization with 1 dose of MMR vaccine.  Pneumococcal 13-valent conjugate (PCV13) vaccine. When indicated, a person who is uncertain of his immunization history and has no record of immunization should receive the PCV13 vaccine. All adults 65 years of age and older should receive this  vaccine. An adult aged 19 years or older who has certain medical conditions and has not been previously immunized should receive 1 dose of PCV13 vaccine. This PCV13 should be followed with a dose of pneumococcal polysaccharide (PPSV23) vaccine. Adults who are at high risk for pneumococcal disease should obtain the PPSV23 vaccine at least 8 weeks after the dose of PCV13 vaccine. Adults older than 44 years of age who have normal immune system function should obtain the PPSV23 vaccine dose at least 1 year after the dose of PCV13 vaccine.  Pneumococcal polysaccharide (PPSV23) vaccine. When PCV13 is also indicated, PCV13 should be obtained first. All adults aged 65 years and older should be immunized. An adult younger than age 65 years who has certain medical conditions should be immunized. Any person who resides in a nursing home or long-term care facility should be immunized. An adult smoker should be immunized. People with an immunocompromised condition and certain other conditions should receive both PCV13 and PPSV23 vaccines. People with human immunodeficiency virus (HIV) infection should be immunized as soon as possible after diagnosis. Immunization during chemotherapy or radiation therapy should be avoided. Routine use of PPSV23 vaccine is not recommended for American Indians, [place] Natives, or people younger than 65 years unless there are medical conditions that require PPSV23 vaccine. When indicated, people who have unknown immunization and have no record of immunization should receive PPSV23 vaccine. One-time revaccination 5 years after the first dose of PPSV23 is recommended for people aged 19-64 years who have chronic kidney failure, nephrotic syndrome, asplenia, or immunocompromised conditions. People who received 1-2 doses of PPSV23 before age 65 years should receive another dose of PPSV23 vaccine at age 65 years or later if at least 5 years have passed since the previous dose. Doses of PPSV23 are not  needed for people immunized with PPSV23 at or after age 65 years.  Meningococcal vaccine. Adults with asplenia or persistent complement component deficiencies should receive 2 doses of quadrivalent meningococcal conjugate (MenACWY-D) vaccine. The doses should be obtained   at least 2 months apart. Microbiologists working with certain meningococcal bacteria, [place] recruits, people at risk during an outbreak, and people who travel to or live in countries with a high rate of meningitis should be immunized. A first-year college student up through age 34 years who is living in a residence hall should receive a dose if she did not receive a dose on or after her 16th birthday. Adults who have certain high-risk conditions should receive one or more doses of vaccine.  Hepatitis A vaccine. Adults who wish to be protected from this disease, have certain high-risk conditions, work with hepatitis A-infected animals, work in hepatitis A research labs, or travel to or work in countries with a high rate of hepatitis A should be immunized. Adults who were previously unvaccinated and who anticipate close contact with an international adoptee during the first 60 days after arrival in the [place] from a country with a high rate of hepatitis A should be immunized.  Hepatitis B vaccine. Adults who wish to be protected from this disease, have certain high-risk conditions, may be exposed to blood or other infectious body fluids, are household contacts or sex partners of hepatitis B positive people, are clients or workers in certain care facilities, or travel to or work in countries with a high rate of hepatitis B should be immunized.  Haemophilus influenzae type b (Hib) vaccine. A previously unvaccinated person with asplenia or sickle cell disease or having a scheduled splenectomy should receive 1 dose of Hib vaccine. Regardless of previous immunization, a recipient of a hematopoietic stem cell transplant should receive a  3-dose series 6-12 months after her successful transplant. Hib vaccine is not recommended for adults with HIV infection. Preventive Services / Frequency Ages 35 to 4 years  Blood pressure check.** / Every 3-5 years.  Lipid and cholesterol check.** / Every 5 years beginning at age 60.  Clinical breast exam.** / Every 3 years for women in their 71s and 10s.  BRCA-related cancer risk assessment.** / For women who have family members with a BRCA-related cancer (breast, ovarian, tubal, or peritoneal cancers).  Pap test.** / Every 2 years from ages 76 through 26. Every 3 years starting at age 61 through age 76 or 93 with a history of 3 consecutive normal Pap tests.  HPV screening.** / Every 3 years from ages 37 through ages 60 to 51 with a history of 3 consecutive normal Pap tests.  Hepatitis C blood test.** / For any individual with known risks for hepatitis C.  Skin self-exam. / Monthly.  Influenza vaccine. / Every year.  Tetanus, diphtheria, and acellular pertussis (Tdap, Td) vaccine.** / Consult your health care provider. Pregnant women should receive 1 dose of Tdap vaccine during each pregnancy. 1 dose of Td every 10 years.  Varicella vaccine.** / Consult your health care provider. Pregnant females who do not have evidence of immunity should receive the first dose after pregnancy.  HPV vaccine. / 3 doses over 6 months, if 93 and younger. The vaccine is not recommended for use in pregnant females. However, pregnancy testing is not needed before receiving a dose.  Measles, mumps, rubella (MMR) vaccine.** / You need at least 1 dose of MMR if you were born in 1957 or later. You may also need a 2nd dose. For females of childbearing age, rubella immunity should be determined. If there is no evidence of immunity, females who are not pregnant should be vaccinated. If there is no evidence of immunity, females who are  pregnant should delay immunization until after pregnancy.  Pneumococcal  13-valent conjugate (PCV13) vaccine.** / Consult your health care provider.  Pneumococcal polysaccharide (PPSV23) vaccine.** / 1 to 2 doses if you smoke cigarettes or if you have certain conditions.  Meningococcal vaccine.** / 1 dose if you are age 68 to 8 years and a Market researcher living in a residence hall, or have one of several medical conditions, you need to get vaccinated against meningococcal disease. You may also need additional booster doses.  Hepatitis A vaccine.** / Consult your health care provider.  Hepatitis B vaccine.** / Consult your health care provider.  Haemophilus influenzae type b (Hib) vaccine.** / Consult your health care provider. Ages 7 to 53 years  Blood pressure check.** / Every year.  Lipid and cholesterol check.** / Every 5 years beginning at age 25 years.  Lung cancer screening. / Every year if you are aged 11-80 years and have a 30-pack-year history of smoking and currently smoke or have quit within the past 15 years. Yearly screening is stopped once you have quit smoking for at least 15 years or develop a health problem that would prevent you from having lung cancer treatment.  Clinical breast exam.** / Every year after age 48 years.  BRCA-related cancer risk assessment.** / For women who have family members with a BRCA-related cancer (breast, ovarian, tubal, or peritoneal cancers).  Mammogram.** / Every year beginning at age 41 years and continuing for as long as you are in good health. Consult with your health care provider.  Pap test.** / Every 3 years starting at age 65 years through age 37 or 70 years with a history of 3 consecutive normal Pap tests.  HPV screening.** / Every 3 years from ages 72 years through ages 60 to 40 years with a history of 3 consecutive normal Pap tests.  Fecal occult blood test (FOBT) of stool. / Every year beginning at age 21 years and continuing until age 5 years. You may not need to do this test if you get  a colonoscopy every 10 years.  Flexible sigmoidoscopy or colonoscopy.** / Every 5 years for a flexible sigmoidoscopy or every 10 years for a colonoscopy beginning at age 35 years and continuing until age 48 years.  Hepatitis C blood test.** / For all people born from [birth year] through 1965 and any individual with known risks for hepatitis C.  Skin self-exam. / Monthly.  Influenza vaccine. / Every year.  Tetanus, diphtheria, and acellular pertussis (Tdap/Td) vaccine.** / Consult your health care provider. Pregnant women should receive 1 dose of Tdap vaccine during each pregnancy. 1 dose of Td every 10 years.  Varicella vaccine.** / Consult your health care provider. Pregnant females who do not have evidence of immunity should receive the first dose after pregnancy.  Zoster vaccine.** / 1 dose for adults aged 30 years or older.  Measles, mumps, rubella (MMR) vaccine.** / You need at least 1 dose of MMR if you were born in 1957 or later. You may also need a second dose. For females of childbearing age, rubella immunity should be determined. If there is no evidence of immunity, females who are not pregnant should be vaccinated. If there is no evidence of immunity, females who are pregnant should delay immunization until after pregnancy.  Pneumococcal 13-valent conjugate (PCV13) vaccine.** / Consult your health care provider.  Pneumococcal polysaccharide (PPSV23) vaccine.** / 1 to 2 doses if you smoke cigarettes or if you have certain conditions.  Meningococcal vaccine.** /  Consult your health care provider.  Hepatitis A vaccine.** / Consult your health care provider.  Hepatitis B vaccine.** / Consult your health care provider.  Haemophilus influenzae type b (Hib) vaccine.** / Consult your health care provider. Ages 64 years and over  Blood pressure check.** / Every year.  Lipid and cholesterol check.** / Every 5 years beginning at age 23 years.  Lung cancer screening. / Every year if you  are aged 16-80 years and have a 30-pack-year history of smoking and currently smoke or have quit within the past 15 years. Yearly screening is stopped once you have quit smoking for at least 15 years or develop a health problem that would prevent you from having lung cancer treatment.  Clinical breast exam.** / Every year after age 74 years.  BRCA-related cancer risk assessment.** / For women who have family members with a BRCA-related cancer (breast, ovarian, tubal, or peritoneal cancers).  Mammogram.** / Every year beginning at age 44 years and continuing for as long as you are in good health. Consult with your health care provider.  Pap test.** / Every 3 years starting at age 58 years through age 22 or 39 years with 3 consecutive normal Pap tests. Testing can be stopped between 65 and 70 years with 3 consecutive normal Pap tests and no abnormal Pap or HPV tests in the past 10 years.  HPV screening.** / Every 3 years from ages 64 years through ages 70 or 61 years with a history of 3 consecutive normal Pap tests. Testing can be stopped between 65 and 70 years with 3 consecutive normal Pap tests and no abnormal Pap or HPV tests in the past 10 years.  Fecal occult blood test (FOBT) of stool. / Every year beginning at age 40 years and continuing until age 27 years. You may not need to do this test if you get a colonoscopy every 10 years.  Flexible sigmoidoscopy or colonoscopy.** / Every 5 years for a flexible sigmoidoscopy or every 10 years for a colonoscopy beginning at age 7 years and continuing until age 32 years.  Hepatitis C blood test.** / For all people born from [birth year] through 1965 and any individual with known risks for hepatitis C.  Osteoporosis screening.** / A one-time screening for women ages 30 years and over and women at risk for fractures or osteoporosis.  Skin self-exam. / Monthly.  Influenza vaccine. / Every year.  Tetanus, diphtheria, and acellular pertussis (Tdap/Td)  vaccine.** / 1 dose of Td every 10 years.  Varicella vaccine.** / Consult your health care provider.  Zoster vaccine.** / 1 dose for adults aged 35 years or older.  Pneumococcal 13-valent conjugate (PCV13) vaccine.** / Consult your health care provider.  Pneumococcal polysaccharide (PPSV23) vaccine.** / 1 dose for all adults aged 46 years and older.  Meningococcal vaccine.** / Consult your health care provider.  Hepatitis A vaccine.** / Consult your health care provider.  Hepatitis B vaccine.** / Consult your health care provider.  Haemophilus influenzae type b (Hib) vaccine.** / Consult your health care provider. ** Family history and personal history of risk and conditions may change your health care provider's recommendations.   This information is not intended to replace advice given to you by your health care provider. Make sure you discuss any questions you have with your health care provider.   Document Released: 11/09/2001 Document Revised: 10/04/2014 Document Reviewed: 02/08/2011 Elsevier Interactive Patient Education [employer].

## 2015-09-09 NOTE — Addendum Note (Signed)
Addended by: Caffie Pinto on: 09/09/2015 02:38 PM   Modules accepted: Orders

## 2015-09-11 LAB — URINE CULTURE
COLONY COUNT: NO GROWTH
Organism ID, Bacteria: NO GROWTH

## 2015-10-22 ENCOUNTER — Telehealth: Payer: Self-pay | Admitting: Family Medicine

## 2015-10-22 NOTE — Telephone Encounter (Signed)
Kelly Parsons with [facility]  Pt called in questioning bill she received for 1 lab. Per Larwance Sachs the primary DX listed on the Vit D was deficiency rather than preventative testing. If it is recoded insurance will cover. Please have corrected and resubmitted. Please f/u with pt when resubbed.

## 2016-03-26 ENCOUNTER — Other Ambulatory Visit: Payer: Self-pay | Admitting: Emergency Medicine

## 2016-03-26 DIAGNOSIS — E032 Hypothyroidism due to medicaments and other exogenous substances: Secondary | ICD-10-CM

## 2016-03-26 MED ORDER — THYROID 48.75 MG PO TABS: 1.0000 | ORAL_TABLET | Freq: Every morning | ORAL | Status: DC

## 2016-08-12 ENCOUNTER — Telehealth: Payer: Self-pay | Admitting: Family Medicine

## 2016-08-12 NOTE — Telephone Encounter (Signed)
Caller name:Julie-Anne Nutting Relationship to patient: Can be reached:[phone] Pharmacy:  Reason for call:Requesting to know which GYN dr at [facility] that you recommended? She wants to call and schedule appt. Please advise.

## 2016-08-12 NOTE — Telephone Encounter (Signed)
DR Karoline Caldwell or [facility]

## 2016-08-13 NOTE — Telephone Encounter (Signed)
She would like to know the one you see there.

## 2016-08-13 NOTE — Telephone Encounter (Signed)
Pt aware.

## 2016-08-13 NOTE — Telephone Encounter (Signed)
grewal--- I don't think she is taking any new pt though

## 2016-08-25 ENCOUNTER — Telehealth: Payer: Self-pay | Admitting: Family Medicine

## 2016-08-25 NOTE — Telephone Encounter (Signed)
Caller name: Relationship to patient: Self Can be reached: [phone] Pharmacy:  Reason for call: Patient request call back to discuss Thyroid Medication

## 2016-08-26 NOTE — Telephone Encounter (Signed)
Spoke with pt, pt states that she is currently taking Nature-Throid which is a generic brand and the pharmacy stated their having manufacturing issues and can not get the medication. Pt stated she tried several different pharmacies and they're also having the same issue. Pt states pharmacist recommend trying NT Thyroid or Armor but if she is going to switch she should check her Thyroid level. Pt also states that she doesn't want to try Synthroid because her father is currently taking this medication and he is having so many side effect. Pt states she only have 3 days left of medication. Please advise. LB

## 2016-08-30 ENCOUNTER — Other Ambulatory Visit: Payer: Self-pay

## 2016-08-30 MED ORDER — THYROID 30 MG PO TABS: 30.0000 mg | ORAL_TABLET | Freq: Every day | ORAL | 1 refills | Status: AC

## 2016-08-30 NOTE — Telephone Encounter (Signed)
Ok to try armour thyroid 30 mg daily She needs labs done in 2 months-- due for cpe as well

## 2016-08-30 NOTE — Telephone Encounter (Signed)
Medication oredered. Patient notified.

## 2016-10-20 DIAGNOSIS — Z0000 Encounter for general adult medical examination without abnormal findings: Secondary | ICD-10-CM | POA: Diagnosis not present

## 2016-10-27 DIAGNOSIS — Z1389 Encounter for screening for other disorder: Secondary | ICD-10-CM | POA: Diagnosis not present

## 2016-10-27 DIAGNOSIS — E038 Other specified hypothyroidism: Secondary | ICD-10-CM | POA: Diagnosis not present

## 2016-10-27 DIAGNOSIS — Z0000 Encounter for general adult medical examination without abnormal findings: Secondary | ICD-10-CM | POA: Diagnosis not present

## 2016-10-27 DIAGNOSIS — R7301 Impaired fasting glucose: Secondary | ICD-10-CM | POA: Diagnosis not present

## 2016-10-27 DIAGNOSIS — E559 Vitamin D deficiency, unspecified: Secondary | ICD-10-CM | POA: Diagnosis not present

## 2017-11-07 DIAGNOSIS — R7301 Impaired fasting glucose: Secondary | ICD-10-CM | POA: Diagnosis not present

## 2017-11-07 DIAGNOSIS — E038 Other specified hypothyroidism: Secondary | ICD-10-CM | POA: Diagnosis not present

## 2017-11-07 DIAGNOSIS — E559 Vitamin D deficiency, unspecified: Secondary | ICD-10-CM | POA: Diagnosis not present

## 2017-11-07 DIAGNOSIS — R82998 Other abnormal findings in urine: Secondary | ICD-10-CM | POA: Diagnosis not present

## 2017-11-15 DIAGNOSIS — E038 Other specified hypothyroidism: Secondary | ICD-10-CM | POA: Diagnosis not present

## 2017-11-15 DIAGNOSIS — Z1389 Encounter for screening for other disorder: Secondary | ICD-10-CM | POA: Diagnosis not present

## 2017-11-15 DIAGNOSIS — R7301 Impaired fasting glucose: Secondary | ICD-10-CM | POA: Diagnosis not present

## 2017-11-15 DIAGNOSIS — Z0000 Encounter for general adult medical examination without abnormal findings: Secondary | ICD-10-CM | POA: Diagnosis not present

## 2017-11-15 DIAGNOSIS — E7849 Other hyperlipidemia: Secondary | ICD-10-CM | POA: Diagnosis not present

## 2017-11-17 DIAGNOSIS — Z1212 Encounter for screening for malignant neoplasm of rectum: Secondary | ICD-10-CM | POA: Diagnosis not present

## 2017-12-13 ENCOUNTER — Encounter: Payer: Self-pay | Admitting: Internal Medicine

## 2018-02-14 DIAGNOSIS — Z1211 Encounter for screening for malignant neoplasm of colon: Secondary | ICD-10-CM | POA: Diagnosis not present

## 2018-06-15 ENCOUNTER — Other Ambulatory Visit: Payer: Self-pay

## 2018-06-15 ENCOUNTER — Ambulatory Visit (INDEPENDENT_AMBULATORY_CARE_PROVIDER_SITE_OTHER): Payer: 59 | Admitting: Obstetrics & Gynecology

## 2018-06-15 ENCOUNTER — Telehealth: Payer: Self-pay | Admitting: Obstetrics & Gynecology

## 2018-06-15 ENCOUNTER — Other Ambulatory Visit (HOSPITAL_COMMUNITY)
Admission: RE | Admit: 2018-06-15 | Discharge: 2018-06-15 | Disposition: A | Discharge status: Home / Self Care | Payer: 59 | Source: Ambulatory Visit | Attending: Obstetrics & Gynecology | Admitting: Obstetrics & Gynecology

## 2018-06-15 ENCOUNTER — Encounter: Payer: Self-pay | Admitting: Obstetrics & Gynecology

## 2018-06-15 VITALS — BP 94/60 | HR 68 | Resp 16 | Ht 64.25 in | Wt 136.0 lb

## 2018-06-15 DIAGNOSIS — Z124 Encounter for screening for malignant neoplasm of cervix: Secondary | ICD-10-CM | POA: Insufficient documentation

## 2018-06-15 DIAGNOSIS — R3129 Other microscopic hematuria: Secondary | ICD-10-CM | POA: Insufficient documentation

## 2018-06-15 DIAGNOSIS — N951 Menopausal and female climacteric states: Secondary | ICD-10-CM | POA: Diagnosis not present

## 2018-06-15 DIAGNOSIS — Z01419 Encounter for gynecological examination (general) (routine) without abnormal findings: Secondary | ICD-10-CM | POA: Diagnosis not present

## 2018-06-15 DIAGNOSIS — Z0000 Encounter for general adult medical examination without abnormal findings: Secondary | ICD-10-CM

## 2018-06-15 DIAGNOSIS — R311 Benign essential microscopic hematuria: Secondary | ICD-10-CM

## 2018-06-15 DIAGNOSIS — Z1211 Encounter for screening for malignant neoplasm of colon: Secondary | ICD-10-CM

## 2018-06-15 LAB — POCT URINALYSIS DIPSTICK
BILIRUBIN UA: NEGATIVE
GLUCOSE UA: NEGATIVE
Ketones, UA: NEGATIVE
LEUKOCYTES UA: NEGATIVE
Nitrite, UA: NEGATIVE
PH UA: 6 (ref 5.0–8.0)
Protein, UA: NEGATIVE
UROBILINOGEN UA: 0.2 U/dL

## 2018-06-15 NOTE — Telephone Encounter (Signed)
As we do all Paps with the liquid based technology, proximity to menstrual cycle or being on her menstrual cycle does not negatively impact the Pap smear.  Thank her for letting us know but it should be fine.  Thanks.

## 2018-06-15 NOTE — Addendum Note (Signed)
Addended by: Polly Cobia on: 06/15/2018 10:41 AM   Modules accepted: Orders

## 2018-06-15 NOTE — Progress Notes (Signed)
47 y.o. G0P0000 Married Other or two or more races female here for new patient annual exam.  Changed practices due to changes in insurance.  Saw Dr. Carren Rang for years.  Did have an ultrasound in 2016.    Having some insomnia issues.  Gets to sleep without any issues.  She wakes up after five hours, no matter what times she does to sleep.    Cycles are still fairly regular but have changed in the past few months.  Does have a little clotting from time to time.  Last month, cycle came in 3 weeks.    Has hx of infertility.  Stopped OCPs at 23.  Never got pregnancy.  Does have hx of PCOs.    Mother with hx of renal cancer x 2 and breast cancer.  Her genetic testing was negative.    Did have fecal occult testing with Dr. Brigitte Pulse.    Patient's last menstrual period was 05/18/2018.          Sexually active: Yes.    The current method of family planning is none.    Exercising: Yes.    walk/ trampoline  Smoker:  no  Health Maintenance: Pap:  09/2014 Normal  History of abnormal Pap:  Yes, 2015. Repeat normal  MMG:  10/29/14 Normal  Colonoscopy:  Never.  Has been referred for screening colonoscopy.   BMD:   Never TDaP:  ~ 3 years ago Pneumonia vaccine(s):  n/a Shingrix:   n/a Hep C testing: n/a Screening Labs: TSH   UA: RBC= 2+  (saw Dr. Jeffie Pollock)   reports that she has never smoked. She has never used smokeless tobacco. She reports that she does not drink alcohol or use drugs.  Past Medical History:  Diagnosis Date  . Fibroid   . Hematuria   . Hypothyroidism   . Infertility, female   . PCOS (polycystic ovarian syndrome)     History reviewed. No pertinent surgical history.  Current Outpatient Medications  Medication Sig Dispense Refill  . Multiple Vitamin (MULTIVITAMIN) tablet Take 1 tablet by mouth daily.    . Omega-3 Fatty Acids (FISH OIL) 1000 MG CAPS Take by mouth.    . thyroid (ARMOUR THYROID) 30 MG tablet Take 1 tablet (30 mg total) by mouth daily before breakfast. 30 tablet 1  .  VITAMIN D-VITAMIN K PO Take 5,000 Units by mouth every other day.     No current facility-administered medications for this visit.     Family History  Problem Relation Age of Onset  . Kidney disease Mother   . Kidney cancer Mother 53       recurrent age 37  . Breast cancer Mother 73  . Heart attack Paternal Grandfather 18  . Heart disease Paternal Grandfather 55       MI  . Hypothyroidism Father   . Diabetes Maternal Aunt   . Diabetes Maternal Uncle   . Diabetes Maternal Grandmother   . Alcohol abuse Paternal Grandmother   . Diabetes Maternal Aunt   . Diabetes Maternal Uncle   . Colon cancer Maternal Grandfather     Review of Systems  All other systems reviewed and are negative.   Exam:   BP 94/60 (BP Location: Right Arm, Patient Position: Sitting, Cuff Size: Normal)   Pulse 68   Resp 16   Ht 5' 4.25" (1.632 m)   Wt 136 lb (61.7 kg)   LMP 05/18/2018   BMI 23.16 kg/m   Height:   Height: 5' 4.25" (163.2  cm)  Ht Readings from Last 3 Encounters:  06/15/18 5' 4.25" (1.632 m)  09/09/15 5' 4.75" (1.645 m)  11/24/11 5\' 4"  (1.626 m)    General appearance: alert, cooperative and appears stated age Head: Normocephalic, without obvious abnormality, atraumatic Neck: no adenopathy, supple, symmetrical, trachea midline and thyroid normal to inspection and palpation Lungs: clear to auscultation bilaterally Breasts: normal appearance, no masses or tenderness Heart: regular rate and rhythm Abdomen: soft, non-tender; bowel sounds normal; no masses,  no organomegaly Extremities: extremities normal, atraumatic, no cyanosis or edema Skin: Skin color, texture, turgor normal. No rashes or lesions Lymph nodes: Cervical, supraclavicular, and axillary nodes normal. No abnormal inguinal nodes palpated Neurologic: Grossly normal   Pelvic: External genitalia:  no lesions              Urethra:  normal appearing urethra with no masses, tenderness or lesions              Bartholins and  Skenes: normal                 Vagina: normal appearing vagina with normal color and discharge, no lesions              Cervix: no lesions              Pap taken: Yes.   Bimanual Exam:  Uterus:  enlarged, 8 weeks with broadness at fundus weeks size              Adnexa: normal adnexa and no mass, fullness, tenderness               Rectovaginal: Confirms               Anus:  normal sphincter tone, no lesions  Chaperone was present for exam.  A:  Well Woman with normal exam Enlarged uterus with hx of fibroids PCOS hx H/O elevated cholesterol that has normalized with diet and exercise H/O breast and renal cell carcinoma with her mother H/o microscopic hematuria with negative evaluation with Dr. Jeffie Pollock Hypothyroidism  P:   Mammogram guidelines reviewed.  Recommended doing 3D MMG. pap smear with HR HPV obtained today FSH, estradiol and progesterone obtained today Did fecal occult blood testing with Dr. Brigitte Pulse.  American Cancer society guidelines for screening colonoscopy reviewed.  She has declined doing colonoscopy this year. Return annually or prn

## 2018-06-15 NOTE — Telephone Encounter (Signed)
Patient was seen today and "just started her cycle'. She is wondering if this interfere with her pap result?

## 2018-06-15 NOTE — Telephone Encounter (Signed)
Spoke with patient. Patient wanted to make Dr. Sabra Heck aware her menses started after OV today. Patient wanted to make sure pap would not need to be repeated. Advised I will update Dr. Sabra Heck, will return call to office if any additional instructions.   Dr. Sabra Heck -ok to proceed with pap collected today?

## 2018-06-16 LAB — ESTRADIOL: ESTRADIOL: 24.8 pg/mL

## 2018-06-16 LAB — FOLLICLE STIMULATING HORMONE: FSH: 4.8 m[IU]/mL

## 2018-06-16 LAB — TSH: TSH: 1.39 u[IU]/mL (ref 0.450–4.500)

## 2018-06-16 LAB — PROGESTERONE: Progesterone: 0.7 ng/mL

## 2018-06-16 NOTE — Telephone Encounter (Signed)
Left message to call Magdaline Zollars at [phone].  

## 2018-06-19 LAB — CYTOLOGY - PAP
Diagnosis: NEGATIVE
HPV: NOT DETECTED

## 2018-06-19 NOTE — Telephone Encounter (Signed)
Left message to call Jesly Hartmann at [phone].  

## 2018-06-21 NOTE — Telephone Encounter (Signed)
Patient has not returned call.   Routing to Dr. Miller.  Encounter closed.  

## 2018-06-22 ENCOUNTER — Telehealth: Payer: Self-pay | Admitting: *Deleted

## 2018-06-22 NOTE — Telephone Encounter (Signed)
Written by Megan Salon, MD on 06/21/2018 3:04 PM  Kelly Parsons,  I hope you are ok with me sending you the results via Epic. My office tried to call you back to answer your pap smear question and then give tests results. So, if you have any questions--please don't hesitate to send me a message or call the office.   First, your pap smear was normal and the high risk HPV testing was negative. This is good.  Second, your thyroid testing was normal.  Lastly, you FSH (follicle stimulating hormone) was normal so this does not look like perimenopause. However, both the estrogen and progesterone levels were low (so perimenopause). Dyssynchronous results are very common in perimenopause. So, I think you would benefit from some progesterone or even possibly estrogen and progesterone together. I'm guessing you probably don't want to start any estrogen at this time, so I would recommend nightly progesterone with 100-200mg  prometrium. This can make you sleepy, so it would be taken nightly.    Please let me know your thoughts/questions/concerns. Thank you.   Kelly Parsons

## 2018-06-22 NOTE — Telephone Encounter (Signed)
Patient calling for results. Advised as seen below per Dr. Sabra Heck. Patient states she has not set up MyChart yet  Patient declines Rx at this time, would like to do some research on Prometrium, will return call to office to advise or with any additional questions.   Routing to Dr. Lestine Box.   Encounter closed.

## 2019-10-10 ENCOUNTER — Other Ambulatory Visit: Payer: Self-pay | Admitting: Obstetrics & Gynecology

## 2019-10-10 DIAGNOSIS — Z1231 Encounter for screening mammogram for malignant neoplasm of breast: Secondary | ICD-10-CM

## 2019-10-18 ENCOUNTER — Ambulatory Visit (INDEPENDENT_AMBULATORY_CARE_PROVIDER_SITE_OTHER): Payer: 59 | Admitting: Obstetrics & Gynecology

## 2019-10-18 ENCOUNTER — Telehealth: Payer: Self-pay | Admitting: Obstetrics & Gynecology

## 2019-10-18 ENCOUNTER — Encounter: Payer: Self-pay | Admitting: Obstetrics & Gynecology

## 2019-10-18 ENCOUNTER — Other Ambulatory Visit: Payer: Self-pay

## 2019-10-18 VITALS — BP 100/60 | HR 68 | Temp 97.9°F | Resp 10 | Ht 64.0 in | Wt 137.0 lb

## 2019-10-18 DIAGNOSIS — Z23 Encounter for immunization: Secondary | ICD-10-CM | POA: Diagnosis not present

## 2019-10-18 DIAGNOSIS — N898 Other specified noninflammatory disorders of vagina: Secondary | ICD-10-CM | POA: Diagnosis not present

## 2019-10-18 DIAGNOSIS — N920 Excessive and frequent menstruation with regular cycle: Secondary | ICD-10-CM | POA: Diagnosis not present

## 2019-10-18 DIAGNOSIS — N951 Menopausal and female climacteric states: Secondary | ICD-10-CM

## 2019-10-18 DIAGNOSIS — Z01419 Encounter for gynecological examination (general) (routine) without abnormal findings: Secondary | ICD-10-CM | POA: Diagnosis not present

## 2019-10-18 MED ORDER — NORETHINDRONE 0.35 MG PO TABS: 1.0000 | ORAL_TABLET | Freq: Every day | ORAL | 3 refills | Status: DC

## 2019-10-18 NOTE — Progress Notes (Signed)
49 y.o. G0P0000 Married Other or two or more races female here for annual exam.  Patient complains of having heavier periods and extreme fatigue before starting her period.   Cycles are now about every 3 to 3 1/2 weeks.  Feels she is having increased pain on the right side with her cycles.  Naproxyn helped and this resolved.  Flow is heavier and she is wearing a super tampon and an overnight pad.  She has also noted a lot of fatigue right before her cycle.  Also has noted some insomnia about the week before.  This isn't too bothersome.    PCP:  Dr. Brigitte Pulse.  Had blood work in 2020.    Patient's last menstrual period was 09/29/2019.          Sexually active: Yes.    The current method of family planning is none.    Exercising: No.  The patient does not participate in regular exercise at present. Smoker:  no  Health Maintenance: Pap:   06/15/18 Neg:Neg HR HPV  09/2014 Normal  History of abnormal Pap:  Yes, 2015. Repeat was normal MMG:  10/29/14 Normal -- Scheduled 11/19/19 Colonoscopy:  Never.  Saw GI who recommended this at 37.   BMD:   n/a TDaP:  10/2007 Pneumonia vaccine(s):  n/a Shingrix:   no Hep C testing: n/a Screening Labs: PCP   reports that she has never smoked. She has never used smokeless tobacco. She reports that she does not drink alcohol or use drugs.  Past Medical History:  Diagnosis Date  . Fibroid   . Hematuria   . Hypothyroidism   . Infertility, female   . PCOS (polycystic ovarian syndrome)     History reviewed. No pertinent surgical history.  Current Outpatient Medications  Medication Sig Dispense Refill  . Chaste Tree (VITEX EXTRACT PO) Take by mouth.    . Multiple Vitamin (MULTIVITAMIN) tablet Take 1 tablet by mouth daily.    . Omega-3 Fatty Acids (FISH OIL) 1000 MG CAPS Take by mouth.    . Probiotic Product (PROBIOTIC-10 PO) Take by mouth.    . thyroid (ARMOUR THYROID) 30 MG tablet Take 1 tablet (30 mg total) by mouth daily before breakfast. 30 tablet 1  .  VITAMIN D-VITAMIN K PO Take 5,000 Units by mouth every other day.     No current facility-administered medications for this visit.    Family History  Problem Relation Age of Onset  . Kidney disease Mother   . Kidney cancer Mother 52       recurrent age 14  . Breast cancer Mother 53  . Heart attack Paternal Grandfather 58  . Heart disease Paternal Grandfather 77       MI  . Hypothyroidism Father   . Diabetes Maternal Aunt   . Diabetes Maternal Uncle   . Diabetes Maternal Grandmother   . Alcohol abuse Paternal Grandmother   . Diabetes Maternal Aunt   . Diabetes Maternal Uncle   . Colon cancer Maternal Grandfather     Review of Systems  Genitourinary:       Menstrual cycle changes   All other systems reviewed and are negative.   Exam:   BP 100/60 (BP Location: Right Arm, Patient Position: Sitting, Cuff Size: Normal)   Pulse 68   Temp 97.9 F (36.6 C) (Temporal)   Resp 10   Ht 5\' 4"  (1.626 m)   Wt 137 lb (62.1 kg)   LMP 09/29/2019   BMI 23.52 kg/m  Height: 5\' 4"  (162.6 cm)  Ht Readings from Last 3 Encounters:  10/18/19 5\' 4"  (1.626 m)  06/15/18 5' 4.25" (1.632 m)  09/09/15 5' 4.75" (1.645 m)   General appearance: alert, cooperative and appears stated age Head: Normocephalic, without obvious abnormality, atraumatic Neck: no adenopathy, supple, symmetrical, trachea midline and thyroid normal to inspection and palpation Lungs: clear to auscultation bilaterally Breasts: normal appearance, no masses or tenderness Heart: regular rate and rhythm Abdomen: soft, non-tender; bowel sounds normal; no masses,  no organomegaly Extremities: extremities normal, atraumatic, no cyanosis or edema Skin: Skin color, texture, turgor normal. No rashes or lesions Lymph nodes: Cervical, supraclavicular, and axillary nodes normal. No abnormal inguinal nodes palpated Neurologic: Grossly normal   Pelvic: External genitalia:  no lesions              Urethra:  normal appearing urethra  with no masses, tenderness or lesions              Bartholins and Skenes: normal                 Vagina: normal appearing vagina with normal color and discharge, no lesions              Cervix: no lesions              Pap taken: No. Bimanual Exam:  Uterus:  enlarged, 8 weeks size              Adnexa: normal adnexa and no mass, fullness, tenderness               Rectovaginal: Confirms               Anus:  normal sphincter tone, no lesions  Chaperone, Terence Lux, CMA, was present for exam.  A:  Well Woman with normal exam Menorrhagia with h/o fibroids H/o PCOS Family hx of RCC and breast cancer in mother H/o microscopic hematuria with negative evaluation with Dr. Jeffie Pollock Hypothyroidism, followed by Dr. Brigitte Pulse  P:   Mammogram guidelines reviewd.   pap smear with neg HR HPV obtained last year.  Not indicated today. Iron levels, CBC, FSH and progesterone obtained today Pt will return for PUS to assess change in bleeding Tdap will be update today Saw GI in 2020 who recommended colonoscopy at age 13. She will start micronor for bleeding.  Rx to pharmacy.  Will plan recheck after ultrasound is completed. Return annually or prn

## 2019-10-18 NOTE — Telephone Encounter (Signed)
Patient returned call. Reviewed benefit for recommended ultrasound. Patient acknowledges understanding. Patient is scheduled 10/25/2019 with Dr Sabra Heck. Patient is aware of appointment date, arrival time and cancellation policy. wil close encounter

## 2019-10-18 NOTE — Telephone Encounter (Signed)
Call placed to patient to review benefit for recommended ultrasound. Left voicemail message requesting a return call

## 2019-10-20 LAB — PROGESTERONE: Progesterone: 6.4 ng/mL

## 2019-10-20 LAB — IRON,TIBC AND FERRITIN PANEL
Ferritin: 21 ng/mL (ref 15–150)
Iron Saturation: 18 % (ref 15–55)
Iron: 68 ug/dL (ref 27–159)
Total Iron Binding Capacity: 385 ug/dL (ref 250–450)
UIBC: 317 ug/dL (ref 131–425)

## 2019-10-20 LAB — CBC
Hematocrit: 38 % (ref 34.0–46.6)
Hemoglobin: 12.3 g/dL (ref 11.1–15.9)
MCH: 29.3 pg (ref 26.6–33.0)
MCHC: 32.4 g/dL (ref 31.5–35.7)
MCV: 91 fL (ref 79–97)
Platelets: 218 10*3/uL (ref 150–450)
RBC: 4.2 x10E6/uL (ref 3.77–5.28)
RDW: 12.2 % (ref 11.7–15.4)
WBC: 5.7 10*3/uL (ref 3.4–10.8)

## 2019-10-20 LAB — FOLLICLE STIMULATING HORMONE: FSH: 4.1 m[IU]/mL

## 2019-10-22 ENCOUNTER — Telehealth: Payer: Self-pay | Admitting: Obstetrics & Gynecology

## 2019-10-22 NOTE — Telephone Encounter (Signed)
Spoke with patient. Patient asking if recommended PUS is necessary? Patient states pelvic pain resolved and no changes in size of uterus during pelvic exam.   Advised patient PUS was recommended for further evaluation of menorrhagia and enlarged uterus.   Patient did not start Micronor, states she was under the impression she would wait for labs to return.   Advised patient I will review plan of care with Dr. Sabra Heck and f/u, patient agreeable.     Dr. Sabra Heck -please review lab results dated 10/18/19 and advise on PUS.

## 2019-10-22 NOTE — Telephone Encounter (Signed)
Patient called requesting to cancel ultrasound appointment on 10/25/2019. Patient states she is no longer having symptoms. Patient is asking since she is not having symptoms, doe sshe need to reschedule?  Routing to Triage Nurse for reivew

## 2019-10-22 NOTE — Telephone Encounter (Signed)
Please let pt know her progesterone level and FSH are normal.  She is not menopausal yet.  Her CBC was normal and iron levels were normal.  She's been experiencing a lot of fatigue prior to her cycles and I wanted to check for iron deficiency and anemia.  She does not have either one.  She can start the micronor now or can wait until after the ultrasound.  I still do recommend proceeding with that evaluation as well.  Thanks.

## 2019-10-23 NOTE — Telephone Encounter (Signed)
Spoke with patient, advised per Dr. Sabra Heck. Patient request to proceed with PUS. PUS scheduled for 11/01/19 at 12:30pm, consult at 1pm with Dr. Sabra Heck. Questions answered.    Routing to provider for final review. Patient is agreeable to disposition. Will close encounter.  Cc: Lerry Liner, Magdalene Patricia

## 2019-10-25 ENCOUNTER — Other Ambulatory Visit: Payer: 59

## 2019-10-25 ENCOUNTER — Other Ambulatory Visit: Payer: 59 | Admitting: Obstetrics & Gynecology

## 2019-10-30 ENCOUNTER — Other Ambulatory Visit: Payer: Self-pay

## 2019-11-01 ENCOUNTER — Ambulatory Visit (INDEPENDENT_AMBULATORY_CARE_PROVIDER_SITE_OTHER): Payer: 59

## 2019-11-01 ENCOUNTER — Ambulatory Visit (INDEPENDENT_AMBULATORY_CARE_PROVIDER_SITE_OTHER): Payer: 59 | Admitting: Obstetrics & Gynecology

## 2019-11-01 ENCOUNTER — Other Ambulatory Visit: Payer: Self-pay

## 2019-11-01 ENCOUNTER — Encounter: Payer: Self-pay | Admitting: Obstetrics & Gynecology

## 2019-11-01 VITALS — BP 100/52 | HR 76 | Temp 97.3°F | Ht 64.0 in | Wt 138.2 lb

## 2019-11-01 DIAGNOSIS — N9489 Other specified conditions associated with female genital organs and menstrual cycle: Secondary | ICD-10-CM

## 2019-11-01 DIAGNOSIS — N920 Excessive and frequent menstruation with regular cycle: Secondary | ICD-10-CM

## 2019-11-01 DIAGNOSIS — D251 Intramural leiomyoma of uterus: Secondary | ICD-10-CM

## 2019-11-01 DIAGNOSIS — N838 Other noninflammatory disorders of ovary, fallopian tube and broad ligament: Secondary | ICD-10-CM | POA: Diagnosis not present

## 2019-11-01 DIAGNOSIS — D270 Benign neoplasm of right ovary: Secondary | ICD-10-CM

## 2019-11-01 NOTE — Progress Notes (Signed)
49 y.o. G0P0000 Married Other or two or more races female here for pelvic ultrasound due to menorrhagia that she reported at her annual exam on 10/18/2019.  Patient reports that her most recent recent cycle the start on January 28 was actually normal at only about 4 days.  However the one before that was much heavier she had significant amount of fatigue before cycle.  She is experiencing increased right lower quadrant pain with her menstrual cycle as well.  She is taking Naprosyn at times.  She is wearing a super tampon and an overnight pad when the flow is heaviest.  Patient's last menstrual period was 10/25/2019.  Contraception: None  Findings:  UTERUS: 8.9 x 7.7 x 6.8 cm with several fibroids.  The largest is 4.  6 x 3.9 cm.  She also has a 4.7 x 3.3 cm fibroid.  An additional 3.1 x 2.6 and 0.9 x 0.7 cm fibroid is noted as well. EMS: 5.9 mm with a 9 mm echogenic focus with a vascular feeder, possible endometrial polyp  ADNEXA: Left ovary: 3.0 x 1.8 x 1.8 cm with a 1.6 cm follicle noted       Right ovary: 3.0 x 2.1 x 1.7 cm with a 2.2 x 1.7 1.6 cm calcified mass possible fibroma.  It is avascular. CUL DE SAC: No free fluid noted.  Discussion: Images reviewed with patient.  On her exam her uterus is really not enlarged and the measurements of the uterus are fairly normal however there are 2 sizable fibroids present.  We discussed the nature of fibroids as she has many questions.  She is aware these have no malignant potential.  She is also aware these are likely what is causing her change in menstrual cycles.  The solid ovarian finding was discussed with possible etiologies.  The endometrial lesion was discussed as well.  Treatment for fibroids discussed including OCPs, progesterone methods, endometrial ablation, myomectomy, and hysterectomy.  Treatment for the polyp discussed as well as for the ovarian mass.  These would typically include surgical excision and for the ovarian mass could also include  conservative follow-up with serial ultrasounds.  Patient would prefer to proceed with excision of the ovarian mass and for the endometrial polyp.  However she would prefer not to do anything with the fibroids at this time.  We did test for anemia and iron deficiency in January at her annual exam and there was no anemia or iron deficiency noted.  She feels that she is tolerating the bleeding well now that she knows the cause, she will not worry as much about it we discussed timing of procedure.  She has some upcoming visits from friends and would like to postpone until early April.  I recommended a CA-125 today.  We will plan follow-up ultrasound prior to the surgery depending on dates she ultimately chooses.  Assessment: Menorrhagia Fibroid uterus Endometrial polyp Right ovarian mass calcified with shadowing Family history of endometriosis in her sister  Plan: CA-125 is obtained today. We will proceed with scheduling hysteroscopy with polyp resection and laparoscopic RSO.  Patient is aware that I might find endometriosis at the time of her surgery given her family history.  So consent form will also contain possible treatment of endometriosis.  After reviewing findings including pictures with pt, an additional 30 minutes spent with pt discussing findings and answering questions about causes of above findings, treatment options, conservative follow up and timing.  She had many questions and these are all addressed individually.

## 2019-11-02 LAB — CA 125: Cancer Antigen (CA) 125: 8.4 U/mL (ref 0.0–38.1)

## 2019-11-19 ENCOUNTER — Ambulatory Visit: Payer: 59

## 2019-12-24 ENCOUNTER — Telehealth: Payer: Self-pay

## 2019-12-24 NOTE — Telephone Encounter (Signed)
-----   Message from Megan Salon, MD sent at 12/23/2019 11:01 PM EDT ----- Patient has endometrial polyp that we discussed removal of when I saw her about a month ago.  She wanted to wait until April to make a plan for procedure.  She has an ovarian mass that I may remove at the same time as well. Can you touch base with her and see what she is thinking?  Thanks.

## 2019-12-24 NOTE — Telephone Encounter (Signed)
Left message to call Rhianna Raulerson at [phone]. 

## 2019-12-25 ENCOUNTER — Ambulatory Visit
Admission: RE | Admit: 2019-12-25 | Discharge: 2019-12-25 | Disposition: A | Discharge status: Home / Self Care | Payer: 59 | Source: Ambulatory Visit | Attending: Obstetrics & Gynecology | Admitting: Obstetrics & Gynecology

## 2019-12-25 ENCOUNTER — Other Ambulatory Visit: Payer: Self-pay | Admitting: Obstetrics & Gynecology

## 2019-12-25 ENCOUNTER — Other Ambulatory Visit: Payer: Self-pay

## 2019-12-25 DIAGNOSIS — Z1231 Encounter for screening mammogram for malignant neoplasm of breast: Secondary | ICD-10-CM

## 2019-12-26 ENCOUNTER — Other Ambulatory Visit: Payer: Self-pay | Admitting: Obstetrics & Gynecology

## 2019-12-26 DIAGNOSIS — R928 Other abnormal and inconclusive findings on diagnostic imaging of breast: Secondary | ICD-10-CM

## 2019-12-27 NOTE — Telephone Encounter (Signed)
Spoke with patient. Patient has questions regarding surgery.  1. How "pressing" is the time frame? States she can do early May if it needs to be done ASAP, would prefer to wait until June or July.  2.  Since mass is calcified does this mean it will not get any larger? Does that make it safer to postpone?

## 2019-12-31 ENCOUNTER — Other Ambulatory Visit: Payer: Self-pay

## 2019-12-31 ENCOUNTER — Ambulatory Visit
Admission: RE | Admit: 2019-12-31 | Discharge: 2019-12-31 | Disposition: A | Discharge status: Home / Self Care | Payer: 59 | Source: Ambulatory Visit | Attending: Obstetrics & Gynecology | Admitting: Obstetrics & Gynecology

## 2019-12-31 DIAGNOSIS — R928 Other abnormal and inconclusive findings on diagnostic imaging of breast: Secondary | ICD-10-CM

## 2020-01-31 NOTE — Telephone Encounter (Signed)
Could you touch base with pt again?  Can just do hysteroscopy and polyp resection and follow the ovary with ultrasounds as well with the knowledge that if this changes, surgery would need to be done at that time.  She should repeat the PUS and check ovary prior to surgery.

## 2020-02-04 NOTE — Telephone Encounter (Signed)
Left message to call Ramia Sidney at [phone]. 

## 2020-02-13 NOTE — Telephone Encounter (Signed)
Left message to call Tunis Gentle at [phone]. 

## 2020-02-14 NOTE — Telephone Encounter (Signed)
Dr.Miller I have been unable to reach this patient. Please advise.

## 2020-03-24 NOTE — Telephone Encounter (Signed)
Letter to [place] for review upon return to the office.

## 2020-03-24 NOTE — Telephone Encounter (Signed)
I think we should send her a letter stating we've tried to reach her about scheduling her procedure and cannot reach her.  If she desires to proceed, she should please call us.  I think that's enough.

## 2020-04-02 NOTE — Telephone Encounter (Signed)
Letter sent to patient's home address on file. Encounter closed.

## 2020-04-09 ENCOUNTER — Telehealth: Payer: Self-pay | Admitting: Obstetrics & Gynecology

## 2020-04-09 NOTE — Telephone Encounter (Signed)
Patient stated she is returning a call [name]. No open telephone note but letter in chart Kelly Parsons trying to reach patient.

## 2020-04-11 NOTE — Telephone Encounter (Signed)
Spoke with patient regarding surgery options as seen below from [place]. Patient would like to have a repeat ultrasound before deciding on how to proceed. PUS scheduled for 04/29/2020 at 3:30 pm with 4 pm consult with Dr.Miller. Patient is agreeable to date and time.   Megan Salon, MD to Me      2:37 PM Note Could you touch base with pt again?  Can just do hysteroscopy and polyp resection and follow the ovary with ultrasounds as well with the knowledge that if this changes, surgery would need to be done at that time.  She should repeat the PUS and check ovary prior to surgery.       Routing to provider and will close encounter.

## 2020-04-29 ENCOUNTER — Other Ambulatory Visit: Payer: Self-pay

## 2020-04-29 ENCOUNTER — Ambulatory Visit (INDEPENDENT_AMBULATORY_CARE_PROVIDER_SITE_OTHER): Payer: 59 | Admitting: Obstetrics & Gynecology

## 2020-04-29 ENCOUNTER — Encounter: Payer: Self-pay | Admitting: Obstetrics & Gynecology

## 2020-04-29 ENCOUNTER — Ambulatory Visit (INDEPENDENT_AMBULATORY_CARE_PROVIDER_SITE_OTHER): Payer: 59

## 2020-04-29 VITALS — BP 100/60 | HR 68 | Resp 16 | Wt 131.0 lb

## 2020-04-29 DIAGNOSIS — N838 Other noninflammatory disorders of ovary, fallopian tube and broad ligament: Secondary | ICD-10-CM | POA: Diagnosis not present

## 2020-04-29 DIAGNOSIS — D251 Intramural leiomyoma of uterus: Secondary | ICD-10-CM

## 2020-04-29 DIAGNOSIS — D270 Benign neoplasm of right ovary: Secondary | ICD-10-CM

## 2020-04-29 DIAGNOSIS — N9489 Other specified conditions associated with female genital organs and menstrual cycle: Secondary | ICD-10-CM | POA: Diagnosis not present

## 2020-04-29 NOTE — Progress Notes (Signed)
49 y.o. G0P0000 Married Other or two or more races female here for pelvic ultrasound due to h/o menorrhagia, uterine fibroids, endometrial mass, and ovarian solid tumor.  Ultrasound was done in February and oophorectomy with hysteroscopy/polyp resection was discussed.  Pt has postponed this until now and desired repeat PUS.  She is accompanied by her husband today.  Ca-125 11/01/2019 was 8.4.  Patient's last menstrual period was 04/14/2020.  Contraception: none  Findings:  UTERUS: 10.1 x 7.3 x 6.7cm with 4.8 x 4.0cm fibroid, 3.8 x 2.8cm fibroid, 1.8 x 1.7cm fibroid and 1cm fibroid EMS: 5.39mm with 6mm echogenic foci with feeder vessel ADNEXA: Left ovary: 2.3 x 1.7 x 1.5cm        Right ovary: 4.0 x 2. X 2.2cm with solid mass 16 x 17mm (feeder vessel noted into mass) CUL DE SAC:  No free fluid  Discussion:  Ultrasonographer supervised.  Findings discussed with pt and spouse.  Solid finding on ovary still present as well as endometrial mass. Pt and spouse aware if probable polyp removed, she will likely not have a change in her bleeding.  Hysteroscopic resection and RSO discussed.  Recovery and pain management discussed.  Risks discussed including but not limited to bleeding, rare risk of transfusion, infection, 1% risk of uterine perforation with risks of fluid deficit causing cardiac arrythmia, cerebral swelling and/or need to stop procedure early.  Fluid emboli and rare risk of death discussed.  DVT/PE, rare risk of risk of bowel/bladder/ureteral/vascular injury.  Patient aware if pathology abnormal she may need additional treatment.  All questions answered.  She is going to consider this vs continued observation.  Assessment:  Endometrial mass and 78mm solid right ovarian lesion with feeder vessel    Plan:  Hysteroscopic polyp resection, D&C, Laparoscopic RSO will be planned.    After review of ultrasound images and comparison to prior ultrasound, additional 35 minutes spent in total with pt and  spouse

## 2020-05-05 ENCOUNTER — Telehealth: Payer: Self-pay | Admitting: Obstetrics & Gynecology

## 2020-05-05 NOTE — Telephone Encounter (Signed)
Spoke with patient regarding surgery benefits. Patient acknowledges understanding of information presented. Patient is aware that benefits presented are for professional benefits only. Patient is aware that once surgery is scheduled, the hospital will call with separate benefits. Patient is aware of surgery cancellation policy.  Patient stated that she is looking to proceed with surgery at the end of October. Patient to call back after speaking with [facility] to inquire about hospital benefits prior to scheduling.

## 2020-05-16 NOTE — Telephone Encounter (Signed)
Left message to call Chuckie Mccathern at [phone]. 

## 2020-05-20 NOTE — Telephone Encounter (Signed)
Patient is returning call to [facility]. Patient states she would like to schedule at surgery at the end of October.

## 2020-05-23 NOTE — Telephone Encounter (Signed)
Returned call to patient. Left message calling to discuss options for surgery dates in October.  Can call back to [place] or [place].

## 2020-05-26 ENCOUNTER — Telehealth: Payer: Self-pay

## 2020-05-26 ENCOUNTER — Encounter: Payer: Self-pay | Admitting: Obstetrics & Gynecology

## 2020-05-26 NOTE — Telephone Encounter (Signed)
Pt sent following mychart message:   Orland Jarred, MD 7 hours ago (9:59 AM)   I am sorry we have been playing voice mail and messaging phone tag.  I am ready to get surgery scheduled for the removal of the uterine polyp and the tumor on my right ovary per Dr Ammie Ferrier reccommendation.  Would Oct 25/26 be available? The week after is also good for me.   Ideally, I'd like to schedule the surgery early enough to where I could do my follow up visit with Dr. Sabra Heck before her responsibilities at another practice begin.  I'm available today on  my cell before 11:30 am and after 1 pm. I'm available tomorrow before 3pm.  Let's try to connect again this week.  Thanks, [place]

## 2020-05-26 NOTE — Telephone Encounter (Signed)
Patient is returning call.  °

## 2020-05-26 NOTE — Telephone Encounter (Signed)
Routing to [name], RN °

## 2020-05-27 NOTE — Telephone Encounter (Signed)
Spoke with patient. Patient spoke with her family and is rearranging plans. Can proceed with surgery on 07/15/2020. Hysteroscopy D&C Myosure polyp resection with lap RSO scheduled for 07/15/2020 at 0730 at [facility]. Pre op scheduled for 06/26/2020 at 11:30 am. COVID test scheduled for 07/11/2020 at 3 pm at [facility] location. Patient is aware of the need to quarantine after test until surgery. 2 week post op scheduled for 07/29/2020 at 8:30 am with Dr.Miller. Surgery instructions reviewed. Patient verbalizes understanding. Surgery packet to Joy to provide to patient at her pre op.  Routing to provider and will close encounter.

## 2020-05-27 NOTE — Telephone Encounter (Signed)
Please see telephone encounter dated 05/05/2020. Encounter closed.

## 2020-05-27 NOTE — Telephone Encounter (Signed)
Spoke with patient. Discussed latest date that Dr.Miller is going to be performing this surgery will be 07/15/2020. Patient states that every weekend in September and October she is traveling or has family coming into town and will not be able to quarantine after COVID test until surgery. Patient can only do this on 10/25-10/26. Asking if she may do so and follow up with another MD in office for post op or if it is okay for her to wait until later in the year to have this with Dr.Miller.

## 2020-06-26 ENCOUNTER — Encounter: Payer: Self-pay | Admitting: Obstetrics & Gynecology

## 2020-06-26 ENCOUNTER — Ambulatory Visit (INDEPENDENT_AMBULATORY_CARE_PROVIDER_SITE_OTHER): Payer: 59 | Admitting: Obstetrics & Gynecology

## 2020-06-26 ENCOUNTER — Other Ambulatory Visit: Payer: Self-pay

## 2020-06-26 ENCOUNTER — Other Ambulatory Visit: Payer: Self-pay | Admitting: Obstetrics & Gynecology

## 2020-06-26 VITALS — BP 104/72 | HR 68 | Resp 16 | Wt 134.0 lb

## 2020-06-26 DIAGNOSIS — N920 Excessive and frequent menstruation with regular cycle: Secondary | ICD-10-CM

## 2020-06-26 DIAGNOSIS — N9489 Other specified conditions associated with female genital organs and menstrual cycle: Secondary | ICD-10-CM

## 2020-06-26 DIAGNOSIS — D251 Intramural leiomyoma of uterus: Secondary | ICD-10-CM

## 2020-06-26 DIAGNOSIS — N838 Other noninflammatory disorders of ovary, fallopian tube and broad ligament: Secondary | ICD-10-CM

## 2020-06-26 NOTE — Progress Notes (Signed)
49 y.o. G0P0000 Married Other or two or more races female here for discussion of upcoming procedure.  Laparoscopic RSO with hysteroscopy, polyp resection, D&C planned due to solid right adnexal mass and endometrial polyp.  Pre-op evaluation thus far has included ultrasound on 04/29/2020.  Patient does have known fibroids.  She also has a echogenic focus within the endometrium and feeder vessel consistent with a polyp.  The right ovary has a 16 x 12 mm solid mass with a feeder vessel into the mass.  These were present on imaging done in February.  We discussed possible procedure at that time.  Patient postponed until now.  Ca 125 in February was 8.4.  Procedure discussed with patient.  Hospital stay, recovery and pain management all discussed.  Risks discussed including but not limited to bleeding, <1% risk of receiving a  transfusion, infection, <1% risk of bowel/bladder/ureteral/vascular injury discussed as well as possible need for additional surgery if injury does occur discussed.  DVT/PE and rare risk of death discussed.  My actual complications with prior surgeries discussed.  Hernia formation discussed.  Positioning and incision locations discussed.  Patient aware if pathology abnormal she may need additional treatment.  All questions answered.    Ob Hx:   Patient's last menstrual period was 06/22/2020 (exact date).          Sexually active: yes Birth control: none Last pap: 9/19, neg with neg HR HPV Last MMG: 12/31/2019 Tobacco: non smoker  History reviewed. No pertinent surgical history.  Past Medical History:  Diagnosis Date  . Fibroid   . Hematuria   . Hypothyroidism   . Infertility, female   . PCOS (polycystic ovarian syndrome)     Allergies: Patient has no known allergies.  Current Outpatient Medications  Medication Sig Dispense Refill  . Probiotic Product (PROBIOTIC-10 PO) Take by mouth.    . thyroid (ARMOUR THYROID) 30 MG tablet Take 1 tablet (30 mg total) by mouth daily before  breakfast. 30 tablet 1  . VITAMIN D-VITAMIN K PO Take 5,000 Units by mouth daily.      No current facility-administered medications for this visit.    ROS: A comprehensive review of systems was negative.  Exam:    BP 104/72   Pulse 68   Resp 16   Wt 134 lb (60.8 kg)   LMP 06/22/2020 (Exact Date)   BMI 23.00 kg/m   General appearance: alert and cooperative Head: Normocephalic, without obvious abnormality, atraumatic Neck: no adenopathy, supple, symmetrical, trachea midline and thyroid not enlarged, symmetric, no tenderness/mass/nodules Lungs: clear to auscultation bilaterally Heart: regular rate and rhythm, S1, S2 normal, no murmur, click, rub or gallop Abdomen: soft, non-tender; bowel sounds normal; no masses,  no organomegaly Extremities: extremities normal, atraumatic, no cyanosis or edema Skin: Skin color, texture, turgor normal. No rashes or lesions Neurologic: Grossly normal   A: Menorrhagia with h/o endometrial polyp, uterine fibroids, solid right adnexal mass     P:  Laparoscopic RSO planned.  Pt does not want left tube removed.  Hysteroscopy with polyp resection, D&C is planned as well. Post op pain management discussed.   Post op do's and don't's discussed.

## 2020-07-09 ENCOUNTER — Encounter (HOSPITAL_BASED_OUTPATIENT_CLINIC_OR_DEPARTMENT_OTHER): Payer: Self-pay | Admitting: Obstetrics & Gynecology

## 2020-07-09 ENCOUNTER — Other Ambulatory Visit: Payer: Self-pay

## 2020-07-09 NOTE — Progress Notes (Signed)
Spoke w/ via phone for pre-op interview---Kelly Pouncey Hardin Negus RN  Lab needs dos----CBC , Serum preg                Lab results------none  COVID test ------10/15 300pm Arrive at -------0530am NPO after MN NO Solid Food.  Clear liquids from MN until---0430am Medications to take morning of surgery -----Thyroid ARmour  Diabetic medication ----none Patient Special Instructions -----none  Pre-Op special Istructions -----none  Patient verbalized understanding of instructions that were given at this phone interview. Patient denies shortness of breath, chest pain, fever, cough at this phone interview. Patient asked about taking hibiclens shower.  Informed pt that was not ordered by MD but she could use hibiclens to shower nite before and am of .  Marland Kitchen  Patient voiced understanding.

## 2020-07-11 ENCOUNTER — Other Ambulatory Visit (HOSPITAL_COMMUNITY)
Admission: RE | Admit: 2020-07-11 | Discharge: 2020-07-11 | Disposition: A | Discharge status: Home / Self Care | Payer: 59 | Source: Ambulatory Visit | Attending: Obstetrics & Gynecology | Admitting: Obstetrics & Gynecology

## 2020-07-11 DIAGNOSIS — Z01812 Encounter for preprocedural laboratory examination: Secondary | ICD-10-CM | POA: Diagnosis present

## 2020-07-11 DIAGNOSIS — Z20822 Contact with and (suspected) exposure to covid-19: Secondary | ICD-10-CM | POA: Insufficient documentation

## 2020-07-11 LAB — SARS CORONAVIRUS 2 (TAT 6-24 HRS): SARS Coronavirus 2: NEGATIVE

## 2020-07-15 ENCOUNTER — Ambulatory Visit (HOSPITAL_BASED_OUTPATIENT_CLINIC_OR_DEPARTMENT_OTHER)
Admission: RE | Admit: 2020-07-15 | Discharge: 2020-07-15 | Disposition: A | Discharge status: Home / Self Care | Payer: 59 | Attending: Obstetrics & Gynecology | Admitting: Obstetrics & Gynecology

## 2020-07-15 ENCOUNTER — Encounter (HOSPITAL_BASED_OUTPATIENT_CLINIC_OR_DEPARTMENT_OTHER)
Admission: RE | Disposition: A | Discharge status: Home / Self Care | Payer: Self-pay | Source: Home / Self Care | Attending: Obstetrics & Gynecology

## 2020-07-15 ENCOUNTER — Ambulatory Visit (HOSPITAL_BASED_OUTPATIENT_CLINIC_OR_DEPARTMENT_OTHER): Payer: 59 | Admitting: Anesthesiology

## 2020-07-15 ENCOUNTER — Encounter (HOSPITAL_BASED_OUTPATIENT_CLINIC_OR_DEPARTMENT_OTHER): Payer: Self-pay | Admitting: Obstetrics & Gynecology

## 2020-07-15 ENCOUNTER — Other Ambulatory Visit: Payer: Self-pay

## 2020-07-15 DIAGNOSIS — Z79890 Hormone replacement therapy (postmenopausal): Secondary | ICD-10-CM | POA: Insufficient documentation

## 2020-07-15 DIAGNOSIS — N840 Polyp of corpus uteri: Secondary | ICD-10-CM | POA: Diagnosis not present

## 2020-07-15 DIAGNOSIS — Z8349 Family history of other endocrine, nutritional and metabolic diseases: Secondary | ICD-10-CM | POA: Insufficient documentation

## 2020-07-15 DIAGNOSIS — D270 Benign neoplasm of right ovary: Secondary | ICD-10-CM | POA: Diagnosis not present

## 2020-07-15 DIAGNOSIS — E039 Hypothyroidism, unspecified: Secondary | ICD-10-CM | POA: Diagnosis not present

## 2020-07-15 DIAGNOSIS — E282 Polycystic ovarian syndrome: Secondary | ICD-10-CM | POA: Diagnosis not present

## 2020-07-15 DIAGNOSIS — D251 Intramural leiomyoma of uterus: Secondary | ICD-10-CM | POA: Insufficient documentation

## 2020-07-15 HISTORY — PX: LAPAROSCOPIC SALPINGO OOPHERECTOMY: SHX5927

## 2020-07-15 HISTORY — PX: DILATATION & CURETTAGE/HYSTEROSCOPY WITH MYOSURE: SHX6511

## 2020-07-15 HISTORY — DX: Family history of other specified conditions: Z84.89

## 2020-07-15 LAB — TYPE AND SCREEN
ABO/RH(D): O POS
Antibody Screen: NEGATIVE

## 2020-07-15 LAB — CBC
HCT: 38.4 % (ref 36.0–46.0)
Hemoglobin: 12.6 g/dL (ref 12.0–15.0)
MCH: 30.5 pg (ref 26.0–34.0)
MCHC: 32.8 g/dL (ref 30.0–36.0)
MCV: 93 fL (ref 80.0–100.0)
Platelets: 192 10*3/uL (ref 150–400)
RBC: 4.13 MIL/uL (ref 3.87–5.11)
RDW: 12.8 % (ref 11.5–15.5)
WBC: 5.8 10*3/uL (ref 4.0–10.5)
nRBC: 0 % (ref 0.0–0.2)

## 2020-07-15 LAB — HCG, SERUM, QUALITATIVE: Preg, Serum: NEGATIVE

## 2020-07-15 SURGERY — DILATATION & CURETTAGE/HYSTEROSCOPY WITH MYOSURE
Anesthesia: General | Site: Vagina | Laterality: Right

## 2020-07-15 MED ORDER — ACETAMINOPHEN 500 MG PO TABS
ORAL_TABLET | ORAL | Status: AC
  Filled 2020-07-15: qty 2

## 2020-07-15 MED ORDER — ACETAMINOPHEN 500 MG PO TABS
1000.0000 mg | ORAL_TABLET | ORAL | Status: AC
  Administered 2020-07-15: 1000 mg via ORAL

## 2020-07-15 MED ORDER — ROCURONIUM BROMIDE 100 MG/10ML IV SOLN
INTRAVENOUS | Status: DC | PRN
  Administered 2020-07-15: 70 mg via INTRAVENOUS

## 2020-07-15 MED ORDER — LIDOCAINE 2% (20 MG/ML) 5 ML SYRINGE
INTRAMUSCULAR | Status: AC
  Filled 2020-07-15: qty 5

## 2020-07-15 MED ORDER — LACTATED RINGERS IV SOLN: INTRAVENOUS | Status: DC

## 2020-07-15 MED ORDER — LIDOCAINE-EPINEPHRINE 1 %-1:100000 IJ SOLN
INTRAMUSCULAR | Status: DC | PRN
  Administered 2020-07-15: 10 mL

## 2020-07-15 MED ORDER — ROCURONIUM BROMIDE 10 MG/ML (PF) SYRINGE
PREFILLED_SYRINGE | INTRAVENOUS | Status: AC
  Filled 2020-07-15: qty 10

## 2020-07-15 MED ORDER — GLYCOPYRROLATE 0.2 MG/ML IJ SOLN
INTRAMUSCULAR | Status: DC | PRN
  Administered 2020-07-15: .2 mg via INTRAVENOUS

## 2020-07-15 MED ORDER — FENTANYL CITRATE (PF) 100 MCG/2ML IJ SOLN
INTRAMUSCULAR | Status: AC
  Filled 2020-07-15: qty 2

## 2020-07-15 MED ORDER — GLYCOPYRROLATE PF 0.2 MG/ML IJ SOSY
PREFILLED_SYRINGE | INTRAMUSCULAR | Status: AC
  Filled 2020-07-15: qty 1

## 2020-07-15 MED ORDER — ONDANSETRON HCL 4 MG/2ML IJ SOLN
INTRAMUSCULAR | Status: DC | PRN
  Administered 2020-07-15: 4 mg via INTRAVENOUS

## 2020-07-15 MED ORDER — CEFAZOLIN SODIUM-DEXTROSE 2-4 GM/100ML-% IV SOLN
2.0000 g | INTRAVENOUS | Status: AC
  Administered 2020-07-15: 2 g via INTRAVENOUS

## 2020-07-15 MED ORDER — MIDAZOLAM HCL 2 MG/2ML IJ SOLN
INTRAMUSCULAR | Status: AC
  Filled 2020-07-15: qty 2

## 2020-07-15 MED ORDER — DROPERIDOL 2.5 MG/ML IJ SOLN
INTRAMUSCULAR | Status: DC | PRN
  Administered 2020-07-15: .625 mg via INTRAVENOUS

## 2020-07-15 MED ORDER — IBUPROFEN 800 MG PO TABS: 800.0000 mg | ORAL_TABLET | Freq: Three times a day (TID) | ORAL | 0 refills | Status: DC | PRN

## 2020-07-15 MED ORDER — FENTANYL CITRATE (PF) 100 MCG/2ML IJ SOLN
INTRAMUSCULAR | Status: DC | PRN
  Administered 2020-07-15 (×4): 50 ug via INTRAVENOUS

## 2020-07-15 MED ORDER — BUPIVACAINE HCL (PF) 0.25 % IJ SOLN
INTRAMUSCULAR | Status: DC | PRN
  Administered 2020-07-15: 8 mL

## 2020-07-15 MED ORDER — PROPOFOL 10 MG/ML IV BOLUS
INTRAVENOUS | Status: AC
  Filled 2020-07-15: qty 40

## 2020-07-15 MED ORDER — POVIDONE-IODINE 10 % EX SWAB: 2.0000 "application " | Freq: Once | CUTANEOUS | Status: DC

## 2020-07-15 MED ORDER — SUGAMMADEX SODIUM 200 MG/2ML IV SOLN
INTRAVENOUS | Status: DC | PRN
  Administered 2020-07-15: 200 mg via INTRAVENOUS

## 2020-07-15 MED ORDER — ONDANSETRON HCL 4 MG/2ML IJ SOLN
INTRAMUSCULAR | Status: AC
  Filled 2020-07-15: qty 2

## 2020-07-15 MED ORDER — SCOPOLAMINE 1 MG/3DAYS TD PT72
1.0000 | MEDICATED_PATCH | TRANSDERMAL | Status: DC
  Administered 2020-07-15: 1.5 mg via TRANSDERMAL

## 2020-07-15 MED ORDER — SODIUM CHLORIDE 0.9 % IR SOLN
Status: DC | PRN
  Administered 2020-07-15 (×2): 3000 mL

## 2020-07-15 MED ORDER — MIDAZOLAM HCL 5 MG/5ML IJ SOLN
INTRAMUSCULAR | Status: DC | PRN
  Administered 2020-07-15: 2 mg via INTRAVENOUS

## 2020-07-15 MED ORDER — DEXAMETHASONE SODIUM PHOSPHATE 4 MG/ML IJ SOLN
INTRAMUSCULAR | Status: DC | PRN
  Administered 2020-07-15: 10 mg via INTRAVENOUS

## 2020-07-15 MED ORDER — CEFAZOLIN SODIUM-DEXTROSE 2-4 GM/100ML-% IV SOLN
INTRAVENOUS | Status: AC
  Filled 2020-07-15: qty 100

## 2020-07-15 MED ORDER — PROPOFOL 10 MG/ML IV BOLUS
INTRAVENOUS | Status: DC | PRN
  Administered 2020-07-15: 50 mg via INTRAVENOUS
  Administered 2020-07-15: 150 mg via INTRAVENOUS

## 2020-07-15 MED ORDER — EPHEDRINE SULFATE 50 MG/ML IJ SOLN
INTRAMUSCULAR | Status: DC | PRN
  Administered 2020-07-15: 10 mg via INTRAVENOUS

## 2020-07-15 MED ORDER — HYDROCODONE-ACETAMINOPHEN 5-325 MG PO TABS: 1.0000 | ORAL_TABLET | Freq: Four times a day (QID) | ORAL | 0 refills | Status: DC | PRN

## 2020-07-15 MED ORDER — SCOPOLAMINE 1 MG/3DAYS TD PT72
MEDICATED_PATCH | TRANSDERMAL | Status: AC
  Filled 2020-07-15: qty 1

## 2020-07-15 MED ORDER — EPHEDRINE 5 MG/ML INJ
INTRAVENOUS | Status: AC
  Filled 2020-07-15: qty 10

## 2020-07-15 MED ORDER — DEXAMETHASONE SODIUM PHOSPHATE 10 MG/ML IJ SOLN
INTRAMUSCULAR | Status: AC
  Filled 2020-07-15: qty 1

## 2020-07-15 MED ORDER — LIDOCAINE HCL (CARDIAC) PF 100 MG/5ML IV SOSY
PREFILLED_SYRINGE | INTRAVENOUS | Status: DC | PRN
  Administered 2020-07-15: 100 mg via INTRAVENOUS

## 2020-07-15 SURGICAL SUPPLY — 58 items
ADH SKN CLS APL DERMABOND .7 (GAUZE/BANDAGES/DRESSINGS) ×2
BAG SPEC RTRVL LRG 6X4 10 (ENDOMECHANICALS) ×2
CATH ROBINSON RED A/P 16FR (CATHETERS) ×4 IMPLANT
COVER WAND RF STERILE (DRAPES) ×4 IMPLANT
DECANTER SPIKE VIAL GLASS SM (MISCELLANEOUS) ×2 IMPLANT
DERMABOND ADVANCED (GAUZE/BANDAGES/DRESSINGS) ×2
DERMABOND ADVANCED .7 DNX12 (GAUZE/BANDAGES/DRESSINGS) ×2 IMPLANT
DEVICE MYOSURE LITE (MISCELLANEOUS) ×2 IMPLANT
DRSG TELFA 3X8 NADH (GAUZE/BANDAGES/DRESSINGS) ×4 IMPLANT
DURAPREP 26ML APPLICATOR (WOUND CARE) ×4 IMPLANT
GAUZE 4X4 16PLY RFD (DISPOSABLE) ×4 IMPLANT
GLOVE BIO SURGEON STRL SZ 6.5 (GLOVE) ×6 IMPLANT
GLOVE BIO SURGEON STRL SZ7 (GLOVE) ×2 IMPLANT
GLOVE BIO SURGEONS STRL SZ 6.5 (GLOVE) ×2
GLOVE BIOGEL PI IND STRL 7.0 (GLOVE) ×2 IMPLANT
GLOVE BIOGEL PI INDICATOR 7.0 (GLOVE) ×6
GLOVE ECLIPSE 6.5 STRL STRAW (GLOVE) ×8 IMPLANT
GLOVE SURG SS PI 7.0 STRL IVOR (GLOVE) ×2 IMPLANT
GOWN STRL REUS W/ TWL LRG LVL3 (GOWN DISPOSABLE) ×2 IMPLANT
GOWN STRL REUS W/ TWL XL LVL3 (GOWN DISPOSABLE) ×2 IMPLANT
GOWN STRL REUS W/TWL LRG LVL3 (GOWN DISPOSABLE) ×12 IMPLANT
GOWN STRL REUS W/TWL XL LVL3 (GOWN DISPOSABLE) ×8 IMPLANT
HOLDER FOLEY CATH W/STRAP (MISCELLANEOUS) ×4 IMPLANT
IV NS IRRIG 3000ML ARTHROMATIC (IV SOLUTION) ×4 IMPLANT
KIT PROCEDURE FLUENT (KITS) ×4 IMPLANT
KIT TURNOVER CYSTO (KITS) ×4 IMPLANT
LIGASURE VESSEL 5MM BLUNT TIP (ELECTROSURGICAL) ×2 IMPLANT
MANIFOLD NEPTUNE II (INSTRUMENTS) ×2 IMPLANT
NEEDLE INSUFFLATION 120MM (ENDOMECHANICALS) ×4 IMPLANT
NS IRRIG 1000ML POUR BTL (IV SOLUTION) ×4 IMPLANT
PACK LAPAROSCOPY BASIN (CUSTOM PROCEDURE TRAY) ×4 IMPLANT
PACK TRENDGUARD 450 HYBRID PRO (MISCELLANEOUS) ×2 IMPLANT
PACK VAGINAL MINOR WOMEN LF (CUSTOM PROCEDURE TRAY) ×4 IMPLANT
PAD DRESSING TELFA 3X8 NADH (GAUZE/BANDAGES/DRESSINGS) IMPLANT
PAD OB MATERNITY 4.3X12.25 (PERSONAL CARE ITEMS) ×4 IMPLANT
POUCH LAPAROSCOPIC INSTRUMENT (MISCELLANEOUS) ×4 IMPLANT
POUCH SPECIMEN RETRIEVAL 10MM (ENDOMECHANICALS) ×2 IMPLANT
PROTECTOR NERVE ULNAR (MISCELLANEOUS) ×8 IMPLANT
SCISSORS LAP 5X35 DISP (ENDOMECHANICALS) IMPLANT
SEAL CERVICAL OMNI LOK (ABLATOR) IMPLANT
SEAL ROD LENS SCOPE MYOSURE (ABLATOR) ×4 IMPLANT
SET SUCTION IRRIG HYDROSURG (IRRIGATION / IRRIGATOR) ×4 IMPLANT
SET TUBE SMOKE EVAC HIGH FLOW (TUBING) ×4 IMPLANT
SUT MNCRL AB 4-0 PS2 18 (SUTURE) ×2 IMPLANT
SUT VIC AB 4-0 PS2 18 (SUTURE) ×2 IMPLANT
SUT VIC AB 4-0 SH 27 (SUTURE) ×4
SUT VIC AB 4-0 SH 27XANBCTRL (SUTURE) ×2 IMPLANT
SUT VICRYL 0 UR6 27IN ABS (SUTURE) ×6 IMPLANT
SYSTEM CARTER THOMASON II (TROCAR) ×2 IMPLANT
TOWEL OR 17X26 10 PK STRL BLUE (TOWEL DISPOSABLE) ×8 IMPLANT
TRAP SPECIMEN MUCUS 40CC (MISCELLANEOUS) ×2 IMPLANT
TRAY FOLEY W/BAG SLVR 14FR (SET/KITS/TRAYS/PACK) ×2 IMPLANT
TRENDGUARD 450 HYBRID PRO PACK (MISCELLANEOUS) ×4
TROCAR ADV FIXATION 5X100MM (TROCAR) ×4 IMPLANT
TROCAR BLADELESS OPT 5 100 (ENDOMECHANICALS) ×4 IMPLANT
TROCAR XCEL NON-BLD 11X100MML (ENDOMECHANICALS) ×2 IMPLANT
WARMER LAPAROSCOPE (MISCELLANEOUS) ×4 IMPLANT
WATER STERILE IRR 500ML POUR (IV SOLUTION) ×4 IMPLANT

## 2020-07-15 NOTE — Anesthesia Procedure Notes (Signed)
Procedure Name: Intubation Date/Time: 07/15/2020 7:36 AM Performed by: Justice Rocher, CRNA Pre-anesthesia Checklist: Patient identified, Emergency Drugs available, Suction available and Patient being monitored Patient Re-evaluated:Patient Re-evaluated prior to induction Oxygen Delivery Method: Circle system utilized Preoxygenation: Pre-oxygenation with 100% oxygen Induction Type: IV induction Ventilation: Mask ventilation without difficulty Laryngoscope Size: Mac and 3 Grade View: Grade II Tube type: Oral Tube size: 7.0 mm Number of attempts: 1 Airway Equipment and Method: Stylet and Oral airway Placement Confirmation: ETT inserted through vocal cords under direct vision,  positive ETCO2 and breath sounds checked- equal and bilateral Secured at: 21 cm Tube secured with: Tape Dental Injury: Teeth and Oropharynx as per pre-operative assessment

## 2020-07-15 NOTE — Anesthesia Postprocedure Evaluation (Signed)
Anesthesia Post Note  Patient: Kelly Parsons  Procedure(s) Performed: DILATATION & CURETTAGE/HYSTEROSCOPY WITH MYOSURE/POLYP RESECTION (N/A Vagina ) LAPAROSCOPIC RIGHT SALPINGO OOPHORECTOMY, PELVIC WASHINGS (Right Abdomen)     Patient location during evaluation: PACU Anesthesia Type: General Level of consciousness: awake and alert Pain management: pain level controlled Vital Signs Assessment: post-procedure vital signs reviewed and stable Respiratory status: spontaneous breathing, nonlabored ventilation, respiratory function stable and patient connected to nasal cannula oxygen Cardiovascular status: blood pressure returned to baseline and stable Postop Assessment: no apparent nausea or vomiting Anesthetic complications: no   No complications documented.  Last Vitals:  Vitals:   07/15/20 1001 07/15/20 1006  BP: (!) 101/53   Pulse: 69 65  Resp: 16 15  Temp:  (!) 36.3 C  SpO2: 100% 100%    Last Pain:  Vitals:   07/15/20 1006  TempSrc: Oral                 Adon Gehlhausen S

## 2020-07-15 NOTE — Discharge Instructions (Signed)
Post-surgical Instructions, Outpatient Surgery  You may expect to feel dizzy, weak, and drowsy for as long as 24 hours after receiving the medicine that made you sleep (anesthetic). For the first 24 hours after your surgery:    Do not drive a car, ride a bicycle, participate in physical activities, or take public transportation until you are done taking narcotic pain medicines or as directed by Dr. Sabra Heck.   Do not drink alcohol or take tranquilizers.   Do not take medicine that has not been prescribed by your physicians.   Do not sign important papers or make important decisions while on narcotic pain medicines.   Have a responsible person with you.   CARE OF INCISION  If you have a bandage, you may remove it in one day.  If there are steri-strips or dermabond, just let this loosen on its own.   You may shower on the first day after your surgery.  Do not sit in a tub bath for one week.  Avoid heavy lifting (more than 10 pounds/4.5 kilograms), pushing, or pulling for the next two weeks.   Avoid activities that may risk injury to your incisions.   PAIN MANAGEMENT  Motrin 800mg .  (This is the same as 4-200mg  over the counter tablets of Motrin or ibuprofen.)  You may take this every eight hours or as needed for cramping.    Vicodin 5/325mg .  For more severe pain, take one or two tablets every four to six hours as needed for pain control.  (Remember that narcotic pain medications increase your risk of constipation.  If this becomes a problem, you may take an over the counter stool softener like Colace 100mg  up to four times a day.)  If you do not want to take narcotic pain medication, you can alternate the motrin 800mg  with Tylenol 1000 mg (two extra strength Tylenol).  The motrin is every 8 hours and the tylenol is every 6 hours.  DO'S AND DON'T'S  Do not take a tub bath for one week.  You may shower on the first day after your surgery  Do not do any heavy lifting for two weeks.   This increases the chance of bleeding.  Do move around as you feel able.  Stairs are fine.  You may begin to exercise again as you feel able.  Do not lift any weights for two weeks.  Do not put anything in the vagina for two weeks--no tampons, intercourse, or douching.    REGULAR MEDIATIONS/VITAMINS:  You may restart all of your regular medications as prescribed.  You may restart all of your vitamins as you normally take them.    PLEASE CALL OR SEEK MEDICAL CARE IF:  You have persistent nausea and vomiting.   You have trouble eating or drinking.   You have an oral temperature above 100.5.   You have constipation that is not helped by adjusting diet or increasing fluid intake. Pain medicines are a common cause of constipation.   You have heavy vaginal bleeding  You have redness or drainage from your incision(s) or there is increasing pain or tenderness near or in the surgical site.

## 2020-07-15 NOTE — Anesthesia Preprocedure Evaluation (Signed)
Anesthesia Evaluation  Patient identified by MRN, date of birth, ID band Patient awake    Reviewed: Allergy & Precautions, NPO status , Patient's Chart, lab work & pertinent test results  Airway Mallampati: II  TM Distance: >3 FB Neck ROM: Full    Dental no notable dental hx.    Pulmonary neg pulmonary ROS,    Pulmonary exam normal breath sounds clear to auscultation       Cardiovascular negative cardio ROS Normal cardiovascular exam Rhythm:Regular Rate:Normal     Neuro/Psych negative neurological ROS  negative psych ROS   GI/Hepatic negative GI ROS, Neg liver ROS,   Endo/Other  Hypothyroidism PCOS (polycystic ovarian syndrome)  Renal/GU negative Renal ROS  negative genitourinary   Musculoskeletal negative musculoskeletal ROS (+)   Abdominal   Peds negative pediatric ROS (+)  Hematology negative hematology ROS (+)   Anesthesia Other Findings   Reproductive/Obstetrics negative OB ROS                             Anesthesia Physical Anesthesia Plan  ASA: II  Anesthesia Plan: General   Post-op Pain Management:    Induction: Intravenous  PONV Risk Score and Plan: 3 and Ondansetron, Dexamethasone and Treatment may vary due to age or medical condition  Airway Management Planned: LMA  Additional Equipment:   Intra-op Plan:   Post-operative Plan: Extubation in OR  Informed Consent: I have reviewed the patients History and Physical, chart, labs and discussed the procedure including the risks, benefits and alternatives for the proposed anesthesia with the patient or authorized representative who has indicated his/her understanding and acceptance.     Dental advisory given  Plan Discussed with: CRNA and Surgeon  Anesthesia Plan Comments:         Anesthesia Quick Evaluation

## 2020-07-15 NOTE — H&P (Signed)
Kelly Parsons is an 49 y.o. female here for laparoscopic RSO with hysteroscopy, polyp resection, D&C planned due to solid right adnexal mass and endometrial polyp.  Pre-op evaluation thus far has included ultrasound on 04/29/2020.  Patient does have known fibroids.  She also has a echogenic focus within the endometrium and feeder vessel consistent with a polyp.  The right ovary has a 16 x 12 mm solid mass with a feeder vessel into the mass.  These were present on imaging done in February.  Surgery was discussed at that time but pt postponed until now.  Ca 125 in February was 8.4.  Risks benefits and alternatives of conservative management have been discussed.   Pertinent Gynecological History: Menses: regular Contraception: none. Pt has negative HCG. DES exposure: denies Blood transfusions: none Sexually transmitted diseases: no past history Previous GYN Procedures: none  Last mammogram: normal Date: 12/2019 Last pap: normal Date: 05/2018 with neg HR HPV OB History: G0, P0   Menstrual History: LMP:  07/14/2020  Past Medical History:  Diagnosis Date  . Family history of adverse reaction to anesthesia    mother- N/V   . Fibroid   . Hematuria   . Hypothyroidism   . Infertility, female   . PCOS (polycystic ovarian syndrome)     Past Surgical History:  Procedure Laterality Date  . NO PAST SURGERIES      Family History  Problem Relation Age of Onset  . Kidney disease Mother   . Kidney cancer Mother 80       recurrent age 89  . Breast cancer Mother 87  . Heart attack Paternal Grandfather 38  . Heart disease Paternal Grandfather 14       MI  . Hypothyroidism Father   . Diabetes Maternal Aunt   . Diabetes Maternal Uncle   . Diabetes Maternal Grandmother   . Alcohol abuse Paternal Grandmother   . Diabetes Maternal Aunt   . Diabetes Maternal Uncle   . Colon cancer Maternal Grandfather     Social History:  reports that she has never smoked. She has never used smokeless tobacco. She  reports that she does not drink alcohol and does not use drugs.  Allergies: No Known Allergies  Medications Prior to Admission  Medication Sig Dispense Refill Last Dose  . Multiple Vitamin (MULTIVITAMIN WITH MINERALS) TABS tablet Take 1 tablet by mouth daily.   Past Week at Unknown time  . OVER THE COUNTER MEDICATION Magnesium cisglycinate 275 mg daily at hs as needed for sleep   Past Week at Unknown time  . Probiotic Product (PROBIOTIC-10 PO) Take by mouth daily.    Past Week at Unknown time  . thyroid (ARMOUR THYROID) 30 MG tablet Take 1 tablet (30 mg total) by mouth daily before breakfast. 30 tablet 1 07/14/2020 at Unknown time  . VITAMIN D-VITAMIN K PO Take 5,000 Units by mouth daily.    Past Week at Unknown time  . zinc gluconate 50 MG tablet Take 50 mg by mouth daily.   Past Week at Unknown time    Review of Systems  All other systems reviewed and are negative.   Blood pressure (!) 101/56, pulse (!) 59, temperature 97.9 F (36.6 C), temperature source Oral, resp. rate 20, height 5\' 3"  (1.6 m), weight 61.6 kg, last menstrual period 06/22/2020, SpO2 99 %. Physical Exam Constitutional:      Appearance: Normal appearance.  Cardiovascular:     Rate and Rhythm: Normal rate and regular rhythm.  Pulses: Normal pulses.     Heart sounds: Normal heart sounds.  Neurological:     General: No focal deficit present.     Mental Status: She is alert.  Psychiatric:        Mood and Affect: Mood normal.     Results for orders placed or performed during the hospital encounter of 07/15/20 (from the past 24 hour(s))  CBC     Status: None   Collection Time: 07/15/20  6:15 AM  Result Value Ref Range   WBC 5.8 4.0 - 10.5 K/uL   RBC 4.13 3.87 - 5.11 MIL/uL   Hemoglobin 12.6 12.0 - 15.0 g/dL   HCT 38.4 36 - 46 %   MCV 93.0 80.0 - 100.0 fL   MCH 30.5 26.0 - 34.0 pg   MCHC 32.8 30.0 - 36.0 g/dL   RDW 12.8 11.5 - 15.5 %   Platelets 192 150 - 400 K/uL   nRBC 0.0 0.0 - 0.2 %  hCG, serum,  qualitative     Status: None   Collection Time: 07/15/20  6:15 AM  Result Value Ref Range   Preg, Serum NEGATIVE NEGATIVE    No results found.  Assessment/Plan: 49 yo G0 MWF with right adnexal mass and endometrial polyp here for RSO and hysteroscopy with polyp removal.  Risks, benefits and alternatives have been discussed.  Pt here and ready to proceed.  Megan Salon 07/15/2020, 7:12 AM

## 2020-07-15 NOTE — Op Note (Addendum)
07/15/2020  9:29 AM  PATIENT:  Kelly Parsons  49 y.o. female  PRE-OPERATIVE DIAGNOSIS:  Endometrial mass, ovarian mass right, intramural uterine fibroids  POST-OPERATIVE DIAGNOSIS:  Endometrial mass, ovarian mass right, intramural uterine fibriod  PROCEDURE:  Procedure(s): DILATATION & CURETTAGE/HYSTEROSCOPY WITH MYOSURE/POLYP RESECTION LAPAROSCOPIC RIGHT SALPINGO OOPHORECTOMY, PELVIC WASHINGS  SURGEON:  Megan Salon  ASSISTANTS: Josefa Half, MD, was present for the laparoscopic portion of the procedure due to possibly complexity of the case and lack of available RNFA to assist.  The hysteroscopic portion of procedure was done without any assistant.   ANESTHESIA:   general  ESTIMATED BLOOD LOSS: 10 mL  BLOOD ADMINISTERED:none   FLUIDS: 1000cc LR  UOP: 200 cc clear UOP  SPECIMEN:  Right tube and ovary, pelvic washings, endometrial polyp and curettings  DISPOSITION OF SPECIMEN:  PATHOLOGY  FINDINGS: uterine fibroids, solid appearing mass on right ovary, normal appearing tubes, normal left ovary, endometrial polyp and fluffy appearing endometrium  DESCRIPTION OF OPERATION: Patient is taken to the operating room. She is placed in the supine position. She is a running IV in place. Informed consent was present on the chart. SCDs on her lower extremities and functioning properly. Patient was positioned while she was awake.  Her legs were placed in the low lithotomy position in [place]. Her arms were tucked by the side.  General endotracheal anesthesia was administered by the anesthesia staff without difficulty. Dr. Kalman Shan, anesthesia, oversaw case.  Time out performed.    Chlora prep was then used to prep the abdomen and Betadine was used to prep the inner thighs, perineum and vagina. Once 3 minutes had past the patient was draped in a normal standard fashion. The legs were lifted to the high lithotomy position. The cervix was visualized by placing a heavy weighted speculum in the  posterior aspect of the vagina and using a curved Deaver retractor to the retract anteriorly. The anterior lip of the cervix was grasped with single-tooth tenaculum.  The uterus sounded to 8 cm. Hulka clamp was passed through the cervical os and attached to the anterior lip of the cervix as a means of manipulating the cervix during the procedure.  A Foley catheter was placed to straight drain.  Clear urine was noted. Legs were lowered to the low lithotomy position and attention was turned the abdomen.  The umbilicus was everted.  A Veress needle was obtained. Syringe of sterile saline was placed on a open Veress needle.  This was passed into the umbilicus until just when the fluid started to drip.  Then low flow CO2 gas was attached the needle and the pneumoperitoneum was achieved without difficulty. Once 3.5 liters of gas was in the abdomen the Veress needle was removed and a 5 millimeter non-bladed Optiview trocar and port were passed directly to the abdomen. The laparoscope was then used to confirm intraperitoneal placement. Findings noted above.  Locations for RLQ, LLQ, and suprapubic ports were noted by transillumination of the abdominal wall.  0.25% marcaine was used to anesthetize the skin.  79mm skin incision was made in the RLQ and a 87mm port was placed underdirect visualization of the laparoscope.  Then a 75mm skin incision was made and a 84mm nonbladed trochar and port was placed in the LLQ.  All trochars were removed.    Ureters were identified.  Washing were obtained.  Attention was turned to the right side.  With uterus on stretch the, the right IP ligament was serially clamped, cauterized and incised  with a ligasure device.  Then then the mesosalpinx was serially clamped, cauterized and incised.  Finally the uteroovarian pedicle was serially clamped, cauterized and incised.  This freed the tube and ovary.  An endocatch bag was placed in the incision.  This was brought through the incision and ovary  cut in the bag until small enough to fully remove. This was all done in the bag.  Port was replaced.  Pelvis visualized.  CO2 pressured decreased to 74mm Hg to ensure there was no bleeding. Pelvic irrigated.  Right port was removed and fascia was closed using a fascial closure device and #0 vicryl.  Two additional stiches were placed to make this fully closed.   No bleeding was noted. In Interceed was placed across vaginal cuff. Ureters were noted deep in the pelvis to be peristalsing.  At this point the procedure was completed. The largest port was closed with a fascial closure device after the port was removed.  Suture was tied and excellent closure of the fascia was noted.  The remaining instruments were removed.  The pneumoperitoneum was relieved.   The patient was taken out of Trendelenburg positioning.  Several deep breaths were given to the patient's trying to any gas the abdomen and finally the midline port was removed. Remaining ports were removed.   The skin was then closed with subcuticular stitches of 3-0 Vicryl. The skin was cleansed Dermabond was applied.  Attention was turned to the vagina.  The legs were lifted to the high lithotomy position.  A bivalve speculum was placed the vagina. The anterior lip of the cervix was grasped with single-tooth tenaculum.  A paracervical block of 1% lidocaine mixed one-to-one with epinephrine (1:100,000 units).  10 cc was used total. The cervix is dilated up to #21 [facility] dilators. The endometrial cavity sounded to 8 cm.   A Myosure hysteroscope was obtained. Normal saline was used as a hysteroscopic fluid. The hysteroscope was advanced through the endocervical canal into the endometrial cavity. The tubal ostia were noted bilaterally. Additional findings included fluffy endometrium and endometrial polyp was noted.  Using the [facility] lite, the polyp was fully resected.  The hysteroscope was removed. A #1 toothed curette was used to curette the cavity until rough  gritty texture was noted in all quadrants. With revisualization of the hysteroscope, there was no longer any abnormal findings.  The hysteroscope was removed. The fluid deficit was 150 cc. The tenaculum was removed from the anterior lip of the cervix. The speculum was removed from the vagina. The prep was cleansed of the patient's skin. The legs are positioned back in the supine position. Sponge, lap, needle, initially counts were correct x2. Patient was taken to recovery in stable condition.  COUNTS:  YES  PLAN OF CARE: Transfer to PACU

## 2020-07-15 NOTE — Transfer of Care (Signed)
Immediate Anesthesia Transfer of Care Note  Patient: Kelly Parsons  Procedure(s) Performed: Procedure(s) (LRB): DILATATION & CURETTAGE/HYSTEROSCOPY WITH MYOSURE/POLYP RESECTION (N/A) LAPAROSCOPIC RIGHT SALPINGO OOPHORECTOMY, PELVIC WASHINGS (Right)  Patient Location: PACU  Anesthesia Type: General  Level of Consciousness: awake, sedated, patient cooperative and responds to stimulation  Airway & Oxygen Therapy: Patient Spontanous Breathing and Patient connected to Russell 02 and soft FM   Post-op Assessment: Report given to PACU RN, Post -op Vital signs reviewed and stable and Patient moving all extremities  Post vital signs: Reviewed and stable  Complications: No apparent anesthesia complications

## 2020-07-16 ENCOUNTER — Encounter (HOSPITAL_BASED_OUTPATIENT_CLINIC_OR_DEPARTMENT_OTHER): Payer: Self-pay | Admitting: Obstetrics & Gynecology

## 2020-07-16 LAB — CYTOLOGY - NON PAP

## 2020-07-16 LAB — SURGICAL PATHOLOGY

## 2020-07-29 ENCOUNTER — Ambulatory Visit (INDEPENDENT_AMBULATORY_CARE_PROVIDER_SITE_OTHER): Payer: 59 | Admitting: Obstetrics & Gynecology

## 2020-07-29 ENCOUNTER — Encounter: Payer: Self-pay | Admitting: Obstetrics & Gynecology

## 2020-07-29 ENCOUNTER — Other Ambulatory Visit: Payer: Self-pay

## 2020-07-29 VITALS — BP 104/60 | HR 72 | Resp 14 | Ht 63.0 in | Wt 134.8 lb

## 2020-07-29 DIAGNOSIS — D270 Benign neoplasm of right ovary: Secondary | ICD-10-CM

## 2020-07-29 DIAGNOSIS — N8500 Endometrial hyperplasia, unspecified: Secondary | ICD-10-CM

## 2020-07-29 MED ORDER — NORETHINDRONE 0.35 MG PO TABS: 1.0000 | ORAL_TABLET | Freq: Every day | ORAL | 6 refills | Status: DC

## 2020-07-29 NOTE — Progress Notes (Signed)
Post Operative Visit  Procedure:DILATATION & CURETTAGE/HYSTEROSCOPY WITH MYOSURE/POLYP RESECTION (N/A Vagina ) LAPAROSCOPIC RIGHT SALPINGO OOPHORECTOMY, PELVIC WASHINGS (Right Abdomen) Days Post-op: 14 days   Subjective: 49 yo G0 MWF here for post op visit.  Reports there has been a little drainage from her umbilical incision.  She had spotting for about a day post op.  Denies pelvic pain, vaginal discharge, urinary symptoms.  No fevers.  Pathology reviewed.  Endometrial polyp with non-atypical hyperplasia and benign endometrium noted.  Given heavier bleeding (has uterine fibroids), feel treatment with progesterone is reasonable.  repeat endometrial biopsy every 3-6 months for up to one year reviewed.  She does have questions about bio identical hormones.  I would recommend progesterone that is not bioidentical for treatment in this situation.   Right ovary contained benign fibroma.  Cytologic washing showed atypical cells, favored to be mesothelial in origin.  No overt malignancy identified.  Reviewed with pathology and with Dr. Everitt Amber, gyn/oncology.  No gyn/oncology consultation recommended and no additional follow up recommended.  Reviewed all of this with pt and her husband.  All questions answered.  They are comfortable with plan.    Objective: BP 104/60 (BP Location: Right Arm, Patient Position: Sitting, Cuff Size: Normal)   Pulse 72   Resp 14   Ht 5\' 3"  (1.6 m)   Wt 134 lb 12.8 oz (61.1 kg)   LMP 07/14/2020   BMI 23.88 kg/m   EXAM General: alert and no distress Abd: soft, non tender, no masses GI: soft, non tender, incisions clean/dry/intact  Gyn:  NAEFG, vaginal without lesions, discharge or bleeding, uterus non tender Extremities: extremities normal, atraumatic, no cyanosis or edema Vaginal Bleeding: none  Assessment: s/p laparoscopic RSO, hysteroscopy with polyp resection, D&C  Plan: Rx for Micronor to pharmacy.  She is going to let me know if she is going to start the  Micronor or if she is going to use bio-identical HRT Repeat endometrial biopsy will be planned for 6 months.

## 2020-07-29 NOTE — Progress Notes (Signed)
Patient scheduled while in office for 30mo f/u EMB with Dr. Quincy Simmonds on 01/15/20 at 11am. Order placed for precert. Patient is agreeable to date and time.

## 2020-12-10 IMAGING — MG MM DIGITAL DIAGNOSTIC UNILAT*L* W/ TOMO W/ CAD
4 series · 4 of 12 positions shown · non-contrast
Comparison: Previous exam(s).

CLINICAL DATA: Screening recall for a possible breast mass.

EXAM:
DIGITAL DIAGNOSTIC LEFT MAMMOGRAM WITH CAD AND TOMO
ULTRASOUND LEFT BREAST

[L MLO synth-2D]
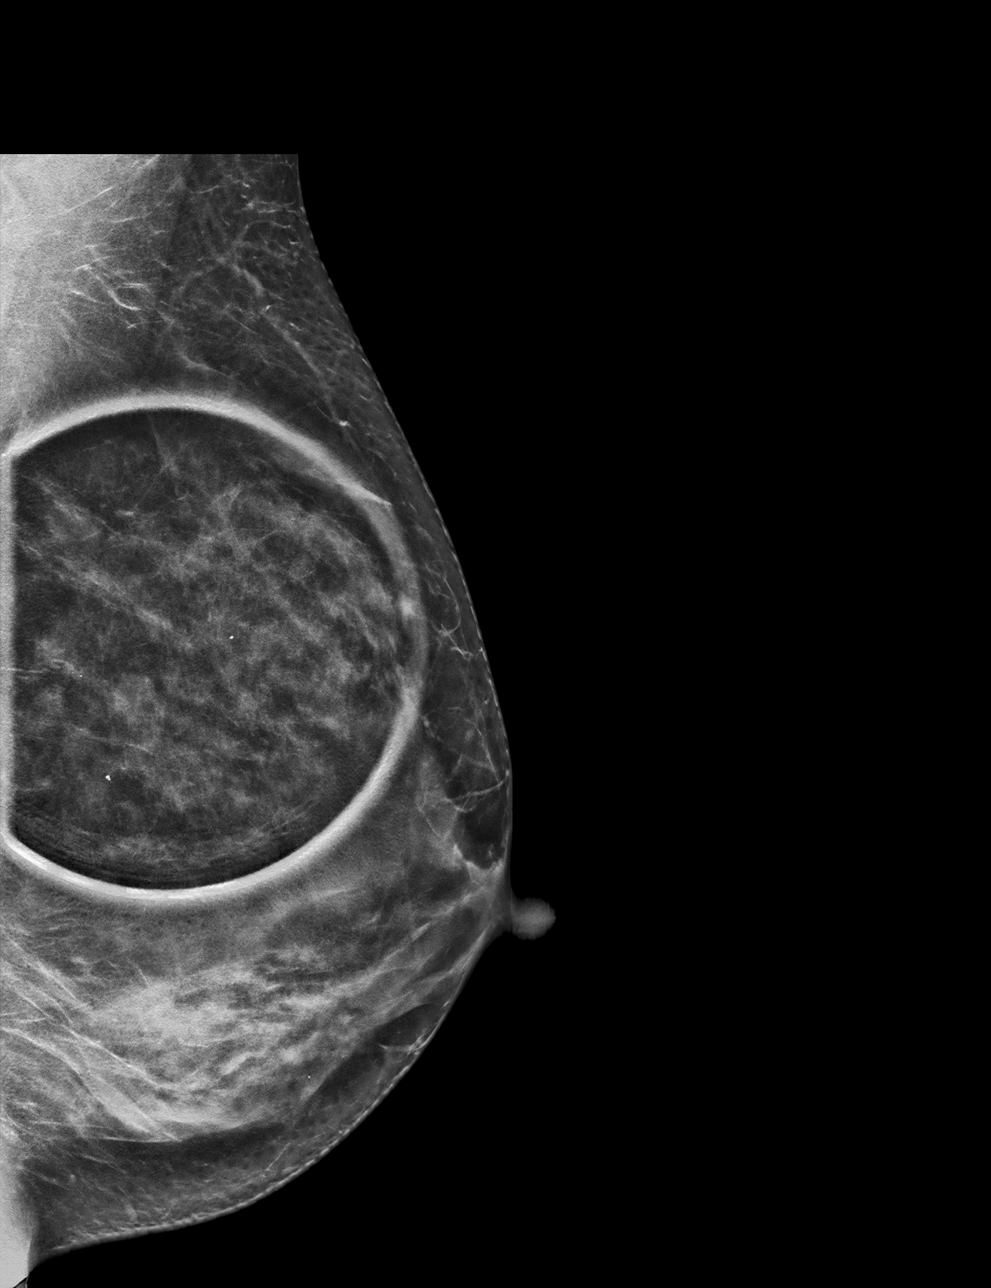

[L CC synth-2D]
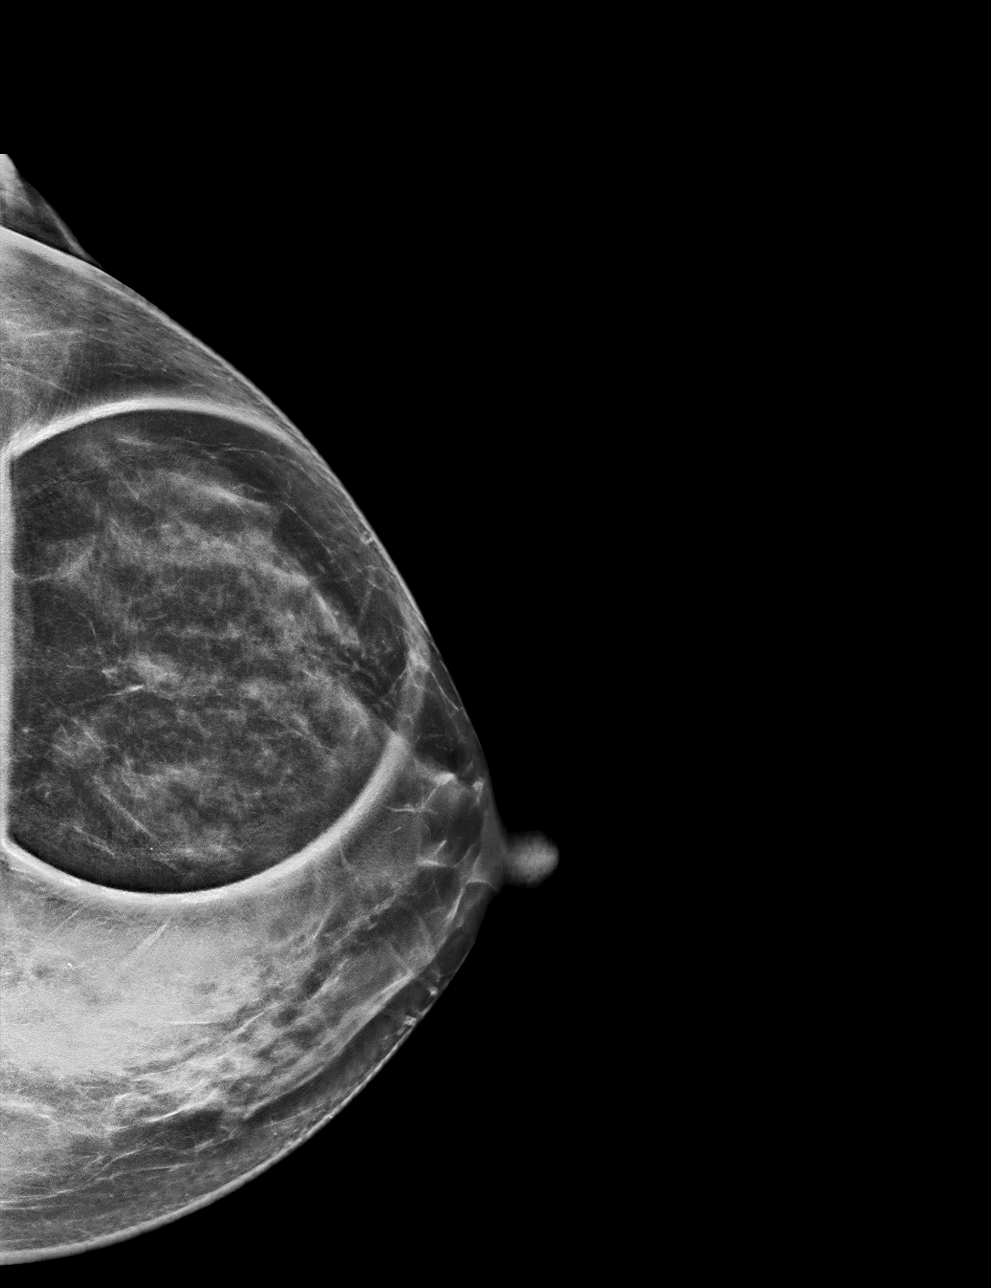

[L CC tomo · tomo slice 35/70.0]
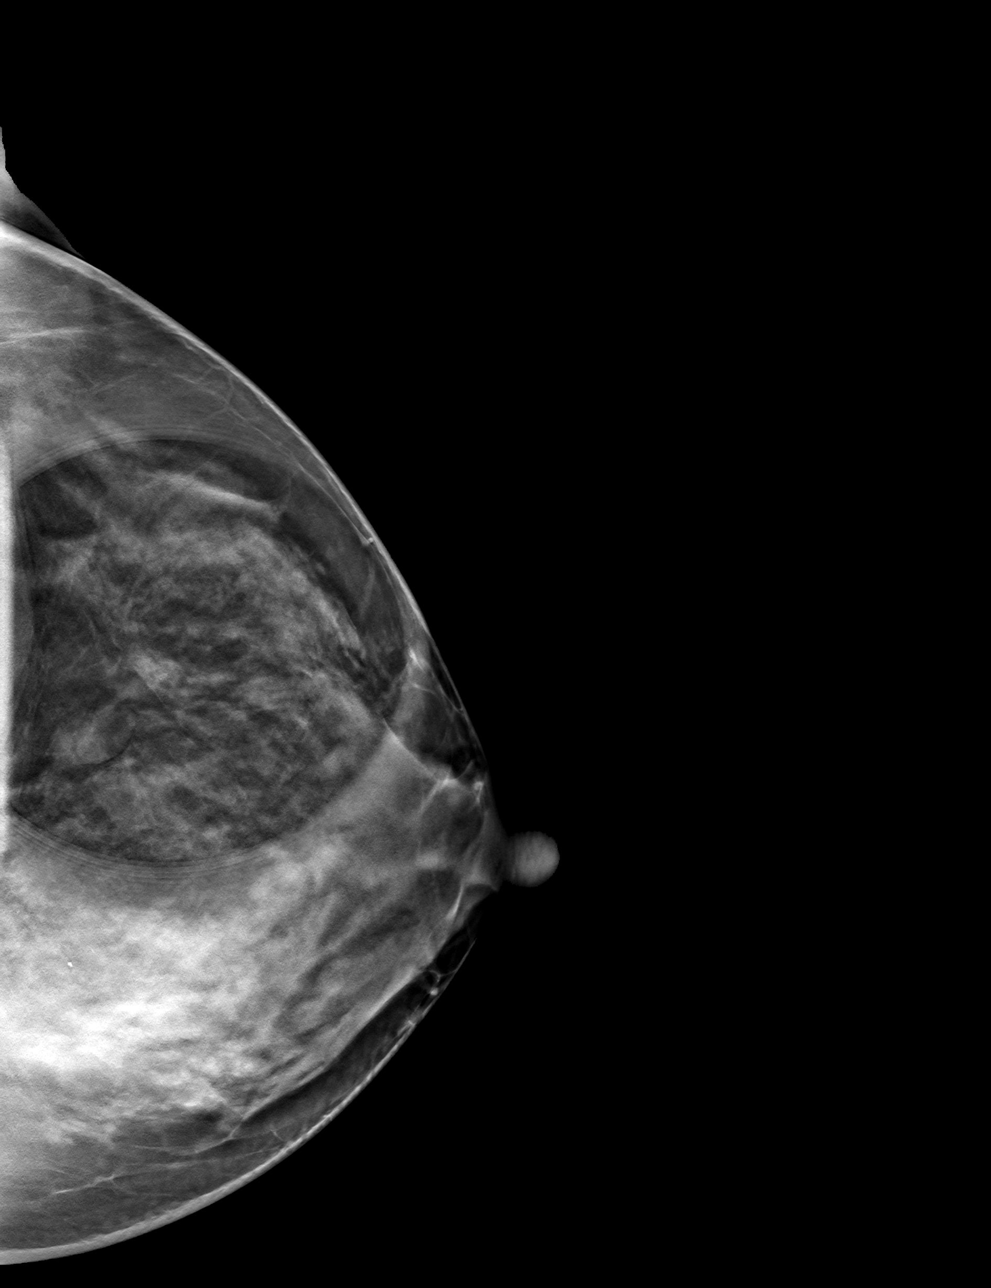

[L MLO tomo · tomo slice 33/66.0]
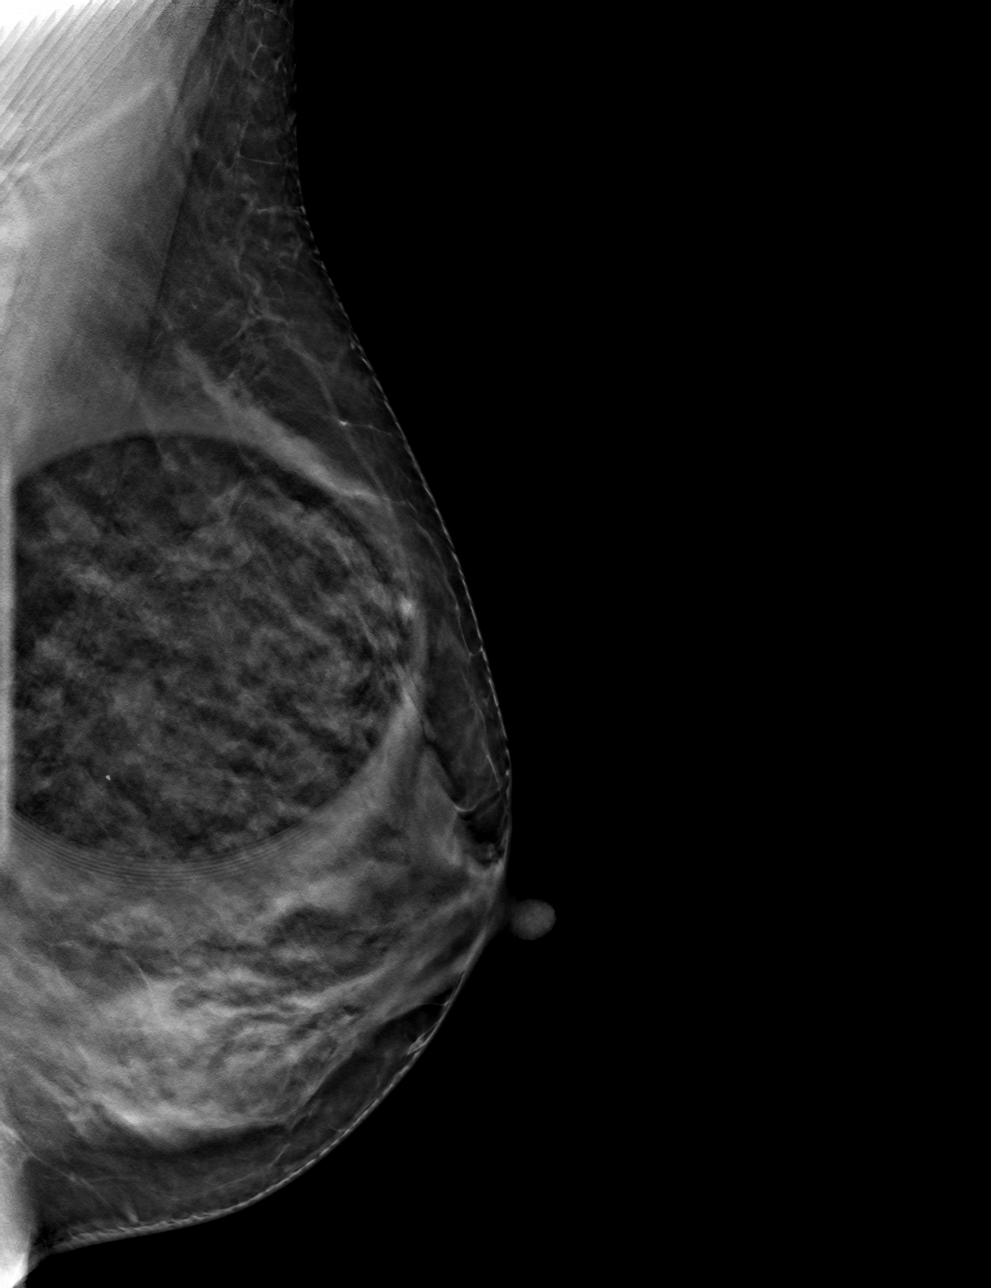

[4 of 12 positions shown; findings below may reference images not displayed]

ACR Breast Density Category c: The breast tissue is heterogeneously
dense, which may obscure small masses.
FINDINGS: Spot compression tomosynthesis images of the left breast demonstrate
an obscured oval mass in the central left breast, posterior depth.
The mass measures approximately 1 cm.

Mammographic images were processed with CAD.

Ultrasound targeted to the central left breast demonstrates numerous
anechoic oval circumscribed masses. Representative images were
acquired at 2 and 4 o'clock. No suspicious solid masses or areas of
shadowing are identified.
IMPRESSION: The mass in the central left breast corresponds with benign
fibrocystic change.

RECOMMENDATION:
Screening mammogram in one year.(Code:HP-9-LFD)

I have discussed the findings and recommendations with the patient.
If applicable, a reminder letter will be sent to the patient
regarding the next appointment.

BI-RADS CATEGORY  2: Benign.

## 2020-12-10 IMAGING — US US BREAST*L* LIMITED INC AXILLA
1 series · 6 of 6 positions shown · non-contrast
Comparison: Previous exam(s).

CLINICAL DATA: Screening recall for a possible breast mass.

EXAM:
DIGITAL DIAGNOSTIC LEFT MAMMOGRAM WITH CAD AND TOMO
ULTRASOUND LEFT BREAST

[Series 1: us breast*left* limited inc axilla · 0.06mm/px · 6 of 6 slices shown]
[im 1/6]
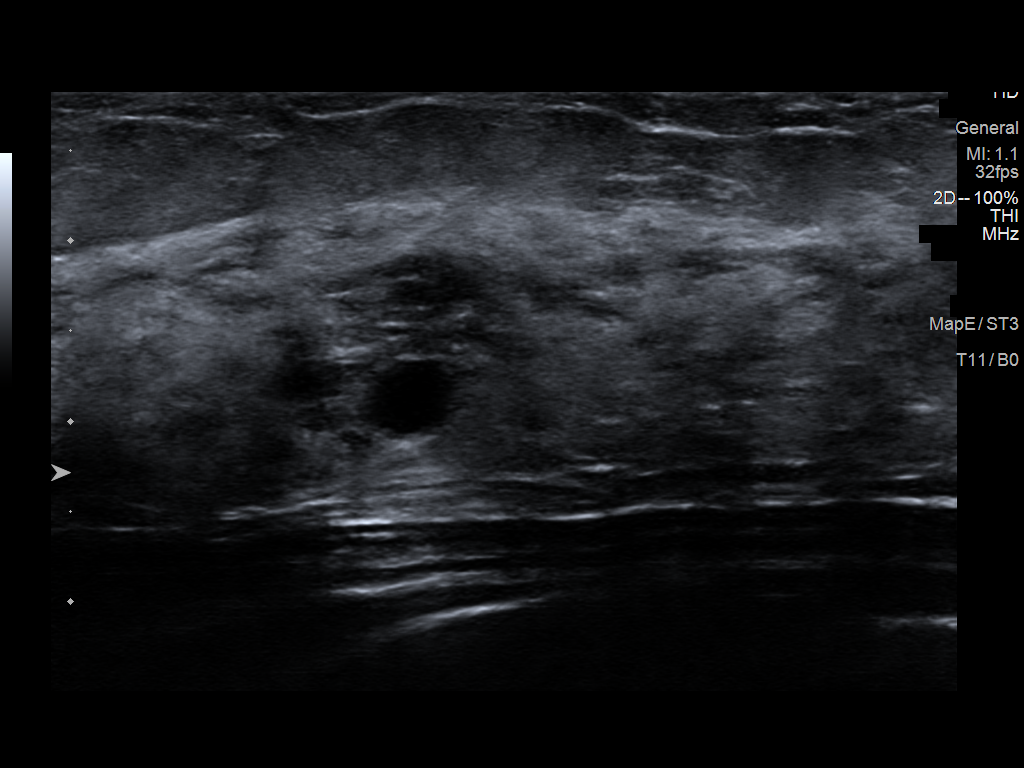
[im 2/6]
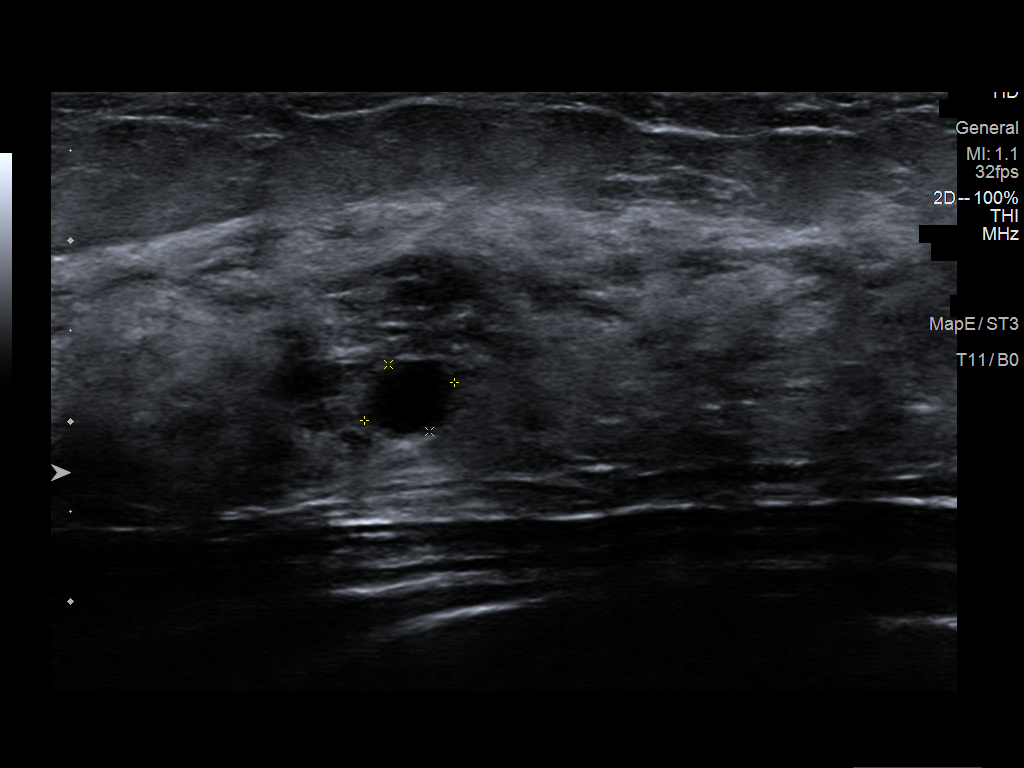
[im 3/6]
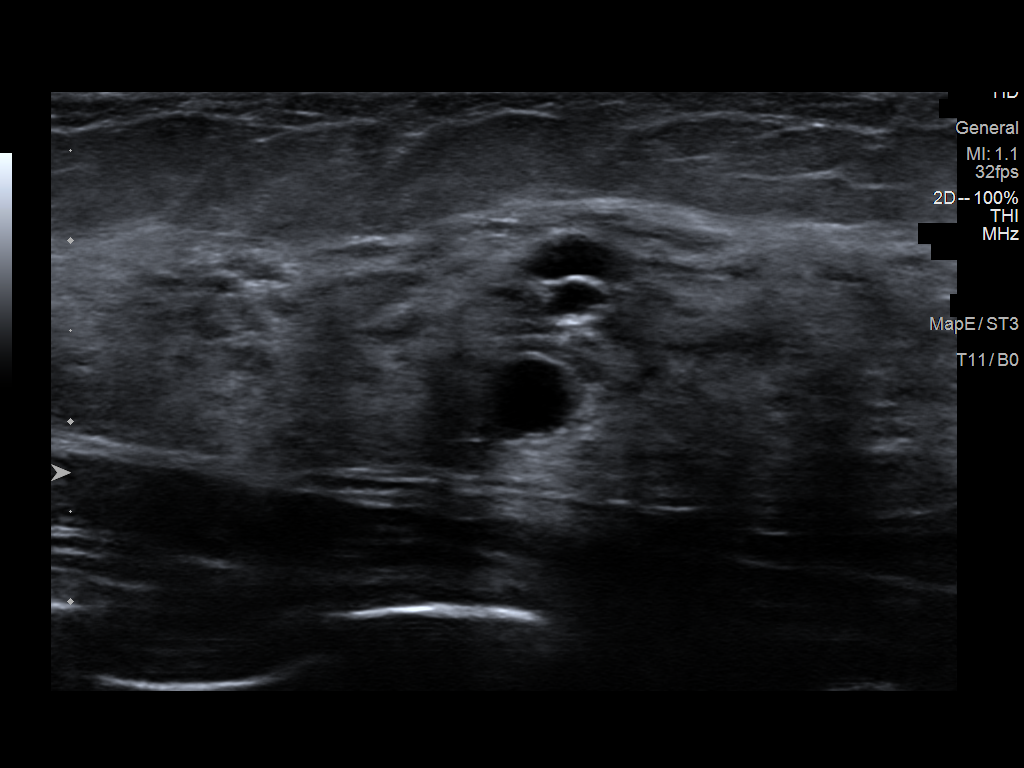
[im 4/6]
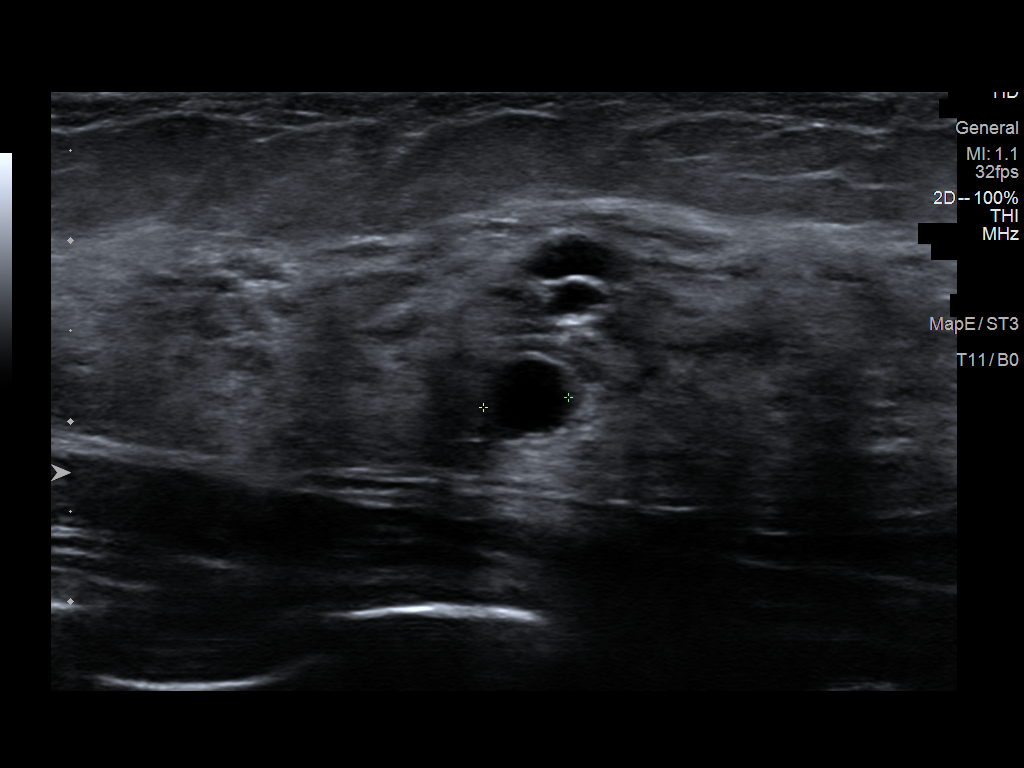
[im 5/6]
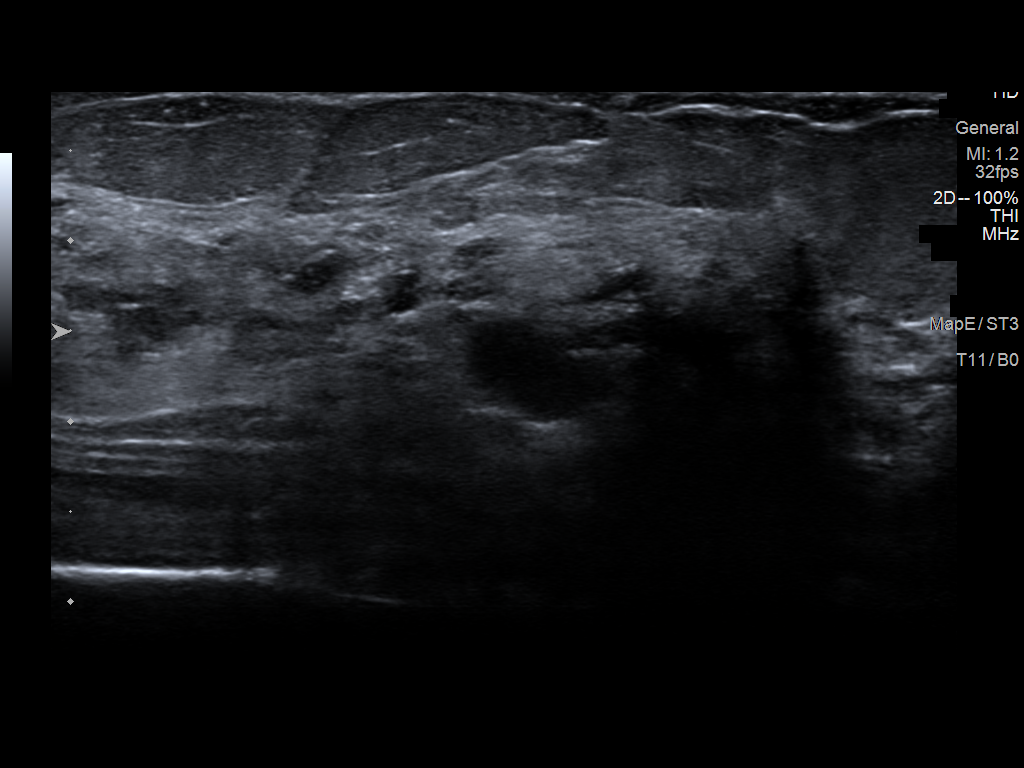
[im 6/6]
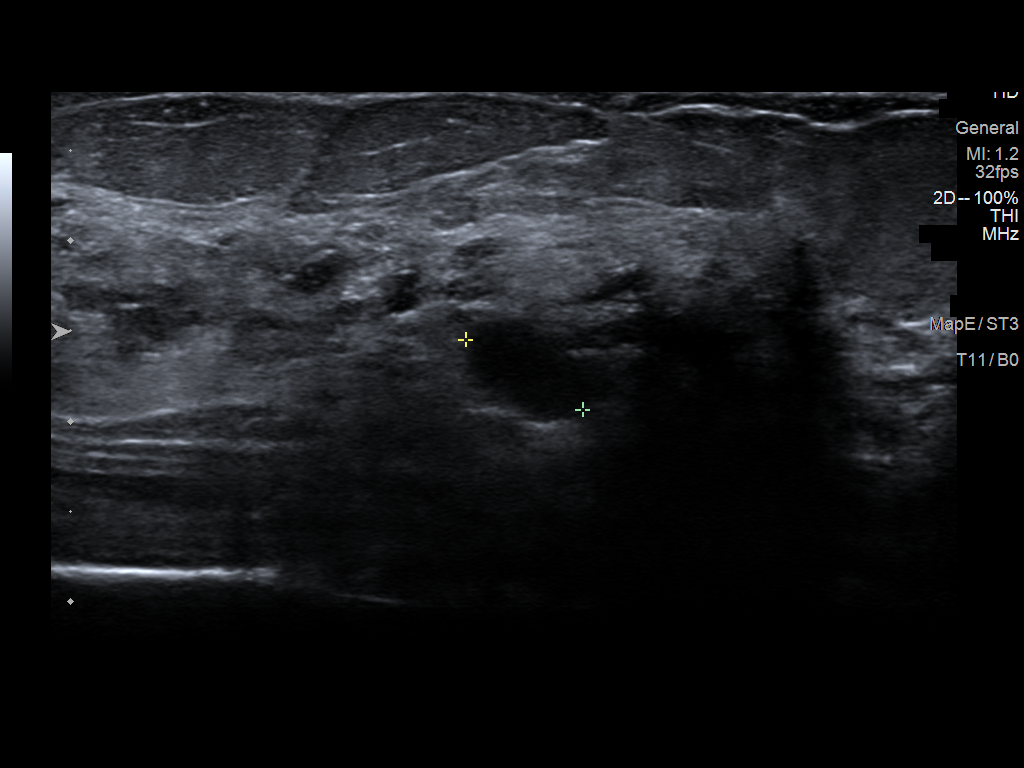

[6 of 6 positions shown; findings below may reference images not displayed]

ACR Breast Density Category c: The breast tissue is heterogeneously
dense, which may obscure small masses.
FINDINGS: Spot compression tomosynthesis images of the left breast demonstrate
an obscured oval mass in the central left breast, posterior depth.
The mass measures approximately 1 cm.

Mammographic images were processed with CAD.

Ultrasound targeted to the central left breast demonstrates numerous
anechoic oval circumscribed masses. Representative images were
acquired at 2 and 4 o'clock. No suspicious solid masses or areas of
shadowing are identified.
IMPRESSION: The mass in the central left breast corresponds with benign
fibrocystic change.

RECOMMENDATION:
Screening mammogram in one year.(Code:HP-9-LFD)

I have discussed the findings and recommendations with the patient.
If applicable, a reminder letter will be sent to the patient
regarding the next appointment.

BI-RADS CATEGORY  2: Benign.

## 2021-01-08 ENCOUNTER — Ambulatory Visit: Payer: 59

## 2021-01-14 ENCOUNTER — Ambulatory Visit: Payer: Self-pay | Admitting: Obstetrics and Gynecology

## 2021-01-28 ENCOUNTER — Ambulatory Visit (HOSPITAL_BASED_OUTPATIENT_CLINIC_OR_DEPARTMENT_OTHER): Payer: 59 | Admitting: Obstetrics & Gynecology

## 2021-03-02 ENCOUNTER — Telehealth (HOSPITAL_BASED_OUTPATIENT_CLINIC_OR_DEPARTMENT_OTHER): Payer: Self-pay | Admitting: *Deleted

## 2021-03-03 NOTE — Telephone Encounter (Signed)
Pt left message wanting to bypass doing a screening mammogram and just do an u/s.  Attempted to call pt back to inform her that screening mammogram has to be done first. Message left for pt to call me back.

## 2021-03-25 ENCOUNTER — Ambulatory Visit (INDEPENDENT_AMBULATORY_CARE_PROVIDER_SITE_OTHER): Payer: 59 | Admitting: Obstetrics & Gynecology

## 2021-03-25 ENCOUNTER — Other Ambulatory Visit: Payer: Self-pay

## 2021-03-25 ENCOUNTER — Encounter (HOSPITAL_BASED_OUTPATIENT_CLINIC_OR_DEPARTMENT_OTHER): Payer: Self-pay | Admitting: Obstetrics & Gynecology

## 2021-03-25 ENCOUNTER — Other Ambulatory Visit (HOSPITAL_COMMUNITY)
Admission: RE | Admit: 2021-03-25 | Discharge: 2021-03-25 | Disposition: A | Discharge status: Home / Self Care | Payer: 59 | Source: Ambulatory Visit | Attending: Obstetrics & Gynecology | Admitting: Obstetrics & Gynecology

## 2021-03-25 VITALS — BP 107/58 | HR 63 | Ht 64.0 in | Wt 137.2 lb

## 2021-03-25 DIAGNOSIS — E282 Polycystic ovarian syndrome: Secondary | ICD-10-CM

## 2021-03-25 DIAGNOSIS — D251 Intramural leiomyoma of uterus: Secondary | ICD-10-CM

## 2021-03-25 DIAGNOSIS — Z01419 Encounter for gynecological examination (general) (routine) without abnormal findings: Secondary | ICD-10-CM | POA: Diagnosis not present

## 2021-03-25 DIAGNOSIS — Z1211 Encounter for screening for malignant neoplasm of colon: Secondary | ICD-10-CM | POA: Diagnosis not present

## 2021-03-25 DIAGNOSIS — Z124 Encounter for screening for malignant neoplasm of cervix: Secondary | ICD-10-CM | POA: Insufficient documentation

## 2021-03-25 DIAGNOSIS — Z803 Family history of malignant neoplasm of breast: Secondary | ICD-10-CM

## 2021-03-25 DIAGNOSIS — N8500 Endometrial hyperplasia, unspecified: Secondary | ICD-10-CM

## 2021-03-25 DIAGNOSIS — D252 Subserosal leiomyoma of uterus: Secondary | ICD-10-CM

## 2021-03-25 NOTE — Progress Notes (Signed)
50 y.o. G0P0000 Married Other or two or more races female here for annual exam.  Cycles are shorter lasting 3 days.  Flow is normal to light.  Had oophorectomy due to fibroma.  Endometrium showed hyperplasia.  Follow up biopsy recommended.  Pt going to beach today so wants to do this at a later date.  Patient's last menstrual period was 03/04/2021 (approximate).          Sexually active: Yes.    The current method of family planning is none.    Exercising: No.  Smoker:  no  Health Maintenance: Pap:  06/15/2018 Negative History of abnormal Pap:  yes in 2015, follow up was normal MMG:  12/25/2019, follow up recommended Colonoscopy:  has been referred to [facility] TDaP:  10/18/2019 Shingrix:   discussed Screening Labs: Dr. Brigitte Pulse on 07/10/2020, blood work done   reports that she has never smoked. She has never used smokeless tobacco. She reports that she does not drink alcohol and does not use drugs.  Past Medical History:  Diagnosis Date   Family history of adverse reaction to anesthesia    mother- N/V    Fibroid    Hematuria    Hypothyroidism    Infertility, female    PCOS (polycystic ovarian syndrome)     Past Surgical History:  Procedure Laterality Date   DILATATION & CURETTAGE/HYSTEROSCOPY WITH MYOSURE N/A 07/15/2020   Procedure: DILATATION & CURETTAGE/HYSTEROSCOPY WITH MYOSURE/POLYP RESECTION;  Surgeon: Megan Salon, MD;  Location: [place];  Service: Gynecology;  Laterality: N/A;   LAPAROSCOPIC SALPINGO OOPHERECTOMY Right 07/15/2020   Procedure: LAPAROSCOPIC RIGHT SALPINGO OOPHORECTOMY, PELVIC WASHINGS;  Surgeon: Megan Salon, MD;  Location: [place];  Service: Gynecology;  Laterality: Right;    Current Outpatient Medications  Medication Sig Dispense Refill   naproxen sodium (ALEVE) 220 MG tablet Take 220 mg by mouth.     Omega-3 Fatty Acids (FISH OIL) 1000 MG CAPS Take by mouth.     OVER THE COUNTER MEDICATION Magnesium cisglycinate  275 mg daily at hs as needed for sleep     Probiotic Product (PROBIOTIC-10 PO) Take by mouth daily.      thyroid (ARMOUR THYROID) 30 MG tablet Take 1 tablet (30 mg total) by mouth daily before breakfast. 30 tablet 1   norethindrone (MICRONOR) 0.35 MG tablet Take 1 tablet (0.35 mg total) by mouth daily. (Patient not taking: Reported on 03/25/2021) 28 tablet 6   No current facility-administered medications for this visit.    Family History  Problem Relation Age of Onset   Kidney disease Mother    Kidney cancer Mother 4       recurrent age 51   Breast cancer Mother 47   Heart attack Paternal Grandfather 26   Heart disease Paternal Grandfather 78       MI   Hypothyroidism Father    Diabetes Maternal Aunt    Diabetes Maternal Uncle    Diabetes Maternal Grandmother    Alcohol abuse Paternal Grandmother    Diabetes Maternal Aunt    Diabetes Maternal Uncle    Colon cancer Maternal Grandfather     Review of Systems  All other systems reviewed and are negative.  Exam:   BP (!) 107/58   Pulse 63   Ht 5\' 4"  (1.626 m)   Wt 137 lb 3.2 oz (62.2 kg)   LMP 03/04/2021 (Approximate)   BMI 23.55 kg/m   Height: 5\' 4"  (162.6 cm)  General appearance: alert,  cooperative and appears stated age Head: Normocephalic, without obvious abnormality, atraumatic Neck: no adenopathy, supple, symmetrical, trachea midline and thyroid normal to inspection and palpation Lungs: clear to auscultation bilaterally Breasts: normal appearance, no masses or tenderness Heart: regular rate and rhythm Abdomen: soft, non-tender; bowel sounds normal; no masses,  no organomegaly Extremities: extremities normal, atraumatic, no cyanosis or edema Skin: Skin color, texture, turgor normal. No rashes or lesions Lymph nodes: Cervical, supraclavicular, and axillary nodes normal. No abnormal inguinal nodes palpated Neurologic: Grossly normal   Pelvic: External genitalia:  no lesions              Urethra:  normal appearing  urethra with no masses, tenderness or lesions              Bartholins and Skenes: normal                 Vagina: normal appearing vagina with normal color and no discharge, no lesions              Cervix: no lesions              Pap taken: yes Bimanual Exam:  Uterus:  mildly enlarged about 10 weeks, globular c/w fibroids, stable in size, mobile              Adnexa: normal adnexa and no mass, fullness, tenderness               Rectovaginal: Confirms               Anus:  normal sphincter tone, no lesions  Chaperone, Octaviano Batty, CMA, was present for exam.  Assessment/Plan: 1. Well woman exam with routine gynecological exam - pap with HR HPV obtained today - MMG 12/25/2019 with follow up 12/2019.  Pt aware due. - referral for colonoscopy placed - vaccines updated - lab work done with Dr. Lang Snow  2. PCOS (polycystic ovarian syndrome)  3. Intramural and subserous leiomyoma of uterus  4. Family history of breast cancer in mother  72.  Endometrial hyperplasia - she will return for this when back from the beach

## 2021-03-28 DIAGNOSIS — Z803 Family history of malignant neoplasm of breast: Secondary | ICD-10-CM | POA: Insufficient documentation

## 2021-03-28 DIAGNOSIS — E282 Polycystic ovarian syndrome: Secondary | ICD-10-CM | POA: Insufficient documentation

## 2021-03-31 LAB — CYTOLOGY - PAP
Comment: NEGATIVE
Diagnosis: NEGATIVE
High risk HPV: NEGATIVE

## 2021-04-15 ENCOUNTER — Other Ambulatory Visit: Payer: Self-pay

## 2021-04-15 ENCOUNTER — Other Ambulatory Visit (HOSPITAL_COMMUNITY)
Admission: RE | Admit: 2021-04-15 | Discharge: 2021-04-15 | Disposition: A | Discharge status: Home / Self Care | Payer: 59 | Source: Ambulatory Visit | Attending: Obstetrics & Gynecology | Admitting: Obstetrics & Gynecology

## 2021-04-15 ENCOUNTER — Encounter (HOSPITAL_BASED_OUTPATIENT_CLINIC_OR_DEPARTMENT_OTHER): Payer: Self-pay | Admitting: Obstetrics & Gynecology

## 2021-04-15 ENCOUNTER — Ambulatory Visit (INDEPENDENT_AMBULATORY_CARE_PROVIDER_SITE_OTHER): Payer: 59 | Admitting: Obstetrics & Gynecology

## 2021-04-15 VITALS — BP 120/65 | HR 62 | Ht 64.0 in | Wt 140.8 lb

## 2021-04-15 DIAGNOSIS — N8500 Endometrial hyperplasia, unspecified: Secondary | ICD-10-CM | POA: Insufficient documentation

## 2021-04-15 DIAGNOSIS — Z1231 Encounter for screening mammogram for malignant neoplasm of breast: Secondary | ICD-10-CM

## 2021-04-15 DIAGNOSIS — Z3202 Encounter for pregnancy test, result negative: Secondary | ICD-10-CM | POA: Diagnosis not present

## 2021-04-15 LAB — POCT URINE PREGNANCY: Preg Test, Ur: NEGATIVE

## 2021-04-15 NOTE — Progress Notes (Signed)
GYNECOLOGY  VISIT  CC:   endometrial biopsy  HPI: 50 y.o. G0P0000 Married Other or two or more races female here for endometrial biopsy.  H/o hyperplasia noted in an endometrial polyp with hysteroscopy last year.  Pt was to take oral progesterone but has not.  States would consider doing that now if done with compounding pharmacy and bio identical.  I am fine with this.  She is having regular cycles.    GYNECOLOGIC HISTORY: LMP:  03/31/2021 Contraception: none.  UPT negative today.  Patient Active Problem List   Diagnosis Date Noted   PCOS (polycystic ovarian syndrome) 03/28/2021   Family history of breast cancer in mother 03/28/2021   NEOPLASMS UNSPEC NATURE BONE SOFT TISSUE&SKIN 06/06/2009   HEMATURIA UNSPECIFIED 05/23/2009   POLYCYSTIC OVARIAN DISEASE 11/10/2007   UNSPECIFIED VITAMIN D DEFICIENCY 11/10/2007    Past Medical History:  Diagnosis Date   Family history of adverse reaction to anesthesia    mother- N/V    Fibroid    Hematuria    Hypothyroidism    Infertility, female    PCOS (polycystic ovarian syndrome)     Past Surgical History:  Procedure Laterality Date   DILATATION & CURETTAGE/HYSTEROSCOPY WITH MYOSURE N/A 07/15/2020   Procedure: DILATATION & CURETTAGE/HYSTEROSCOPY WITH MYOSURE/POLYP RESECTION;  Surgeon: Megan Salon, MD;  Location: [facility];  Service: Gynecology;  Laterality: N/A;   LAPAROSCOPIC SALPINGO OOPHERECTOMY Right 07/15/2020   Procedure: LAPAROSCOPIC RIGHT SALPINGO OOPHORECTOMY, PELVIC WASHINGS;  Surgeon: Megan Salon, MD;  Location: [facility];  Service: Gynecology;  Laterality: Right;    MEDS:   Current Outpatient Medications on File Prior to Visit  Medication Sig Dispense Refill   Omega-3 Fatty Acids (FISH OIL) 1000 MG CAPS Take by mouth.     OVER THE COUNTER MEDICATION Magnesium cisglycinate 275 mg daily at hs as needed for sleep     Probiotic Product (PROBIOTIC-10 PO) Take by mouth daily.      thyroid  (ARMOUR THYROID) 30 MG tablet Take 1 tablet (30 mg total) by mouth daily before breakfast. 30 tablet 1   naproxen sodium (ALEVE) 220 MG tablet Take 220 mg by mouth. (Patient not taking: Reported on 04/15/2021)     No current facility-administered medications on file prior to visit.    ALLERGIES: Patient has no known allergies.  Family History  Problem Relation Age of Onset   Kidney disease Mother    Kidney cancer Mother 64       recurrent age 50   Breast cancer Mother 37   Heart attack Paternal Grandfather 61   Heart disease Paternal Grandfather 9       MI   Hypothyroidism Father    Diabetes Maternal Aunt    Diabetes Maternal Uncle    Diabetes Maternal Grandmother    Alcohol abuse Paternal Grandmother    Diabetes Maternal Aunt    Diabetes Maternal Uncle    Colon cancer Maternal Grandfather     SH:  married, non smoker  PHYSICAL EXAMINATION:    BP 120/65 (BP Location: Right Arm, Patient Position: Sitting, Cuff Size: Small)   Pulse 62   Ht 5\' 4"  (1.626 m)   Wt 140 lb 12.8 oz (63.9 kg)   BMI 24.17 kg/m     General appearance: alert, cooperative and appears stated age Lymph:  no inguinal LAD noted  Pelvic: External genitalia:  no lesions              Urethra:  normal appearing  urethra with no masses, tenderness or lesions              Bartholins and Skenes: normal                 Vagina: normal appearing vagina with normal color and discharge, no lesions              Cervix: no lesions             Endometrial biopsy recommended.  Discussed with patient.  Verbal and written consent obtained.   Procedure:  Speculum placed.  Cervix visualized and cleansed with betadine prep.  A single toothed tenaculum was applied to the anterior lip of the cervix.  Endometrial pipelle was advanced through the cervix into the endometrial cavity without difficulty.  Pipelle passed to 9cm.  Suction applied and pipelle removed with good tissue sample obtained.  Tenculum removed.  No bleeding  noted.  Patient tolerated procedure well.  Chaperone, Octaviano Batty, CMA, was present for exam.  Assessment/Plan: 1. Endometrial hyperplasia without atypia - Surgical pathology( [facility]/ [facility])

## 2021-04-16 LAB — SURGICAL PATHOLOGY

## 2021-04-24 ENCOUNTER — Other Ambulatory Visit (HOSPITAL_BASED_OUTPATIENT_CLINIC_OR_DEPARTMENT_OTHER): Payer: Self-pay | Admitting: Obstetrics & Gynecology

## 2021-04-24 MED ORDER — NONFORMULARY OR COMPOUNDED ITEM: 4 refills | Status: DC

## 2021-06-16 ENCOUNTER — Encounter (HOSPITAL_BASED_OUTPATIENT_CLINIC_OR_DEPARTMENT_OTHER): Payer: Self-pay

## 2021-08-14 ENCOUNTER — Encounter: Payer: Self-pay | Admitting: Internal Medicine

## 2021-10-02 ENCOUNTER — Other Ambulatory Visit: Payer: Self-pay

## 2021-10-02 ENCOUNTER — Ambulatory Visit (AMBULATORY_SURGERY_CENTER): Payer: 59

## 2021-10-02 VITALS — Ht 64.0 in | Wt 145.0 lb

## 2021-10-02 DIAGNOSIS — Z1211 Encounter for screening for malignant neoplasm of colon: Secondary | ICD-10-CM

## 2021-10-02 MED ORDER — NA SULFATE-K SULFATE-MG SULF 17.5-3.13-1.6 GM/177ML PO SOLN: 1.0000 | Freq: Once | ORAL | 0 refills | Status: AC

## 2021-10-02 NOTE — Progress Notes (Signed)
Pre visit completed via phone call; Patient verified name, DOB, and address; No egg or soy allergy known to patient  No issues known to pt with past sedation with any surgeries or procedures-- other than PONV Patient denies ever being told they had issues or difficulty with intubation -----other than PONV No FH of Malignant Hyperthermia Pt is not on diet pills Pt is not on home 02  Pt is not on blood thinners  Pt denies issues with constipation;  No A fib or A flutter NO PA's for preps discussed with pt in PV today  Discussed with pt there will be an out-of-pocket cost for prep and that varies from $0 to 70 + dollars - pt verbalized understanding  Due to the COVID-19 pandemic we are asking patients to follow certain guidelines in PV and the [place]   Pt aware of COVID protocols and [facility] guidelines

## 2021-10-09 ENCOUNTER — Encounter: Payer: Self-pay | Admitting: Internal Medicine

## 2021-10-13 ENCOUNTER — Telehealth: Payer: Self-pay | Admitting: Internal Medicine

## 2021-10-13 ENCOUNTER — Encounter: Payer: Self-pay | Admitting: Internal Medicine

## 2021-10-13 DIAGNOSIS — Z1211 Encounter for screening for malignant neoplasm of colon: Secondary | ICD-10-CM

## 2021-10-13 MED ORDER — CLENPIQ 10-3.5-12 MG-GM -GM/160ML PO SOLN: 1.0000 | ORAL | 0 refills | Status: DC

## 2021-10-13 NOTE — Telephone Encounter (Signed)
Clenpiq to [place] in new instructions Clenpiq to Pt's my chart - called pt and we went over Clenpiq Pt will call if not covered

## 2021-10-13 NOTE — Telephone Encounter (Signed)
Inbound call from patient states she need an alternative for prep. States Suprep is $80 and she was not expecting to pay that much

## 2021-10-14 NOTE — Telephone Encounter (Deleted)
Few things. The insurance does not require precert in an ambulatory center. The code in the referral is screening for colon cancer. It is allowed once every ten years. The final coding that is done before the claim goes in is done by a professional coder. We can't guarantee how insurance will cover anything. The letter we send out has the coverage info IF the procedure is considered diagnostic. Here is the section of the letter that states that:  Potentially, after a $3000 deductible is met, your procedure may be covered at 80%.This would apply only if your insurance conpany considers this as a diagnostic procedure. Most screening procedures are covered at 100%. You do not need to bring payment at the time of the procedure as we will bill your insurance company first. You may also set up payment arrangements if necessary by calling the number at the bottom of your statement.   Angelina sent out the letter to the patient and documented her coverage in the referral. Hope that helps! Thanks

## 2021-10-14 NOTE — Telephone Encounter (Signed)
Few things. °The insurance does not require precert in an ambulatory center. °The code in the referral is screening for colon cancer. It is allowed once every ten years. °The final coding that is done before the claim goes in is done by a professional coder. °We can't guarantee how insurance will cover anything. °The letter we send out has the coverage info IF the procedure is considered diagnostic. °Here is the section of the letter that states that: ° °Potentially, after a $3000 deductible is met, your procedure may be covered at 80%.This would apply only if your insurance conpany considers this as a diagnostic procedure. Most screening procedures are covered at 100%. You do not need to bring payment at the time of the procedure as we will bill your insurance company first. You may also set up payment arrangements if necessary by calling the number at the bottom of your statement.  ° °Angelina sent out the letter to the patient and documented her coverage in the referral. °Hope that helps! °Thanks °

## 2021-10-14 NOTE — Telephone Encounter (Signed)
Per previous phone note-prep was changed to Clenpiq. Pt aware.

## 2021-10-15 ENCOUNTER — Telehealth: Payer: Self-pay | Admitting: *Deleted

## 2021-10-15 NOTE — Telephone Encounter (Signed)
Called patient, no answer. Left a message that new Miralax prep instructions will be sent to mychart so patient can use the OTC prep.Clenpiq coupon is attached to Rx also.

## 2021-10-16 ENCOUNTER — Encounter: Payer: Self-pay | Admitting: Internal Medicine

## 2021-10-16 ENCOUNTER — Ambulatory Visit (AMBULATORY_SURGERY_CENTER): Payer: 59 | Admitting: Internal Medicine

## 2021-10-16 VITALS — BP 106/64 | HR 75 | Temp 98.2°F | Resp 16 | Ht 64.0 in | Wt 145.0 lb

## 2021-10-16 DIAGNOSIS — Z1211 Encounter for screening for malignant neoplasm of colon: Secondary | ICD-10-CM | POA: Diagnosis not present

## 2021-10-16 DIAGNOSIS — D123 Benign neoplasm of transverse colon: Secondary | ICD-10-CM

## 2021-10-16 MED ORDER — SODIUM CHLORIDE 0.9 % IV SOLN: 500.0000 mL | Freq: Once | INTRAVENOUS | Status: DC

## 2021-10-16 NOTE — Progress Notes (Signed)
Pt's states no medical or surgical changes since previsit or office visit. VS by DT. °

## 2021-10-16 NOTE — Op Note (Signed)
[place] Patient Name: Kelly Parsons Procedure Date: 10/16/2021 1:06 PM MRN: [id number] Endoscopist: Sonny Masters "[name] ,  Age: 51 Referring MD:  Date of Birth: [birth year] Gender: Female Account #: [id number] Procedure:                Colonoscopy Indications:              Screening for colorectal malignant neoplasm, This                            is the patient's first colonoscopy Medicines:                Monitored Anesthesia Care Procedure:                Pre-Anesthesia Assessment:                           - Prior to the procedure, a History and Physical                            was performed, and patient medications and                            allergies were reviewed. The patient's tolerance of                            previous anesthesia was also reviewed. The risks                            and benefits of the procedure and the sedation                            options and risks were discussed with the patient.                            All questions were answered, and informed consent                            was obtained. Prior Anticoagulants: The patient has                            taken no previous anticoagulant or antiplatelet                            agents. ASA Grade Assessment: II - A patient with                            mild systemic disease. After reviewing the risks                            and benefits, the patient was deemed in                            satisfactory condition to undergo the procedure.  After obtaining informed consent, the colonoscope                            was passed under direct vision. Throughout the                            procedure, the patient's blood pressure, pulse, and                            oxygen saturations were monitored continuously. The                            Olympus PCF-H190DL (#[id number]) Colonoscope was                            introduced through the  anus and advanced to the the                            terminal ileum. The colonoscopy was performed                            without difficulty. The patient tolerated the                            procedure well. The quality of the bowel                            preparation was good. The terminal ileum, ileocecal                            valve, appendiceal orifice, and rectum were                            photographed. Scope In: 1:38:36 PM Scope Out: 1:53:53 PM Scope Withdrawal Time: 0 hours 11 minutes 29 seconds  Total Procedure Duration: 0 hours 15 minutes 17 seconds  Findings:                 The terminal ileum appeared normal.                           Two sessile polyps were found in the transverse                            colon. The polyps were 3 mm in size.                           Multiple small and large-mouthed diverticula were                            found in the sigmoid colon. Complications:            No immediate complications. Estimated Blood Loss:     Estimated blood loss was minimal. Impression:               - The examined portion  of the ileum was normal.                           - Two 3 mm polyps in the transverse colon.                           - Diverticulosis in the sigmoid colon.                           - No specimens collected. Recommendation:           - Discharge patient to home (with escort).                           - Await pathology results.                           - The findings and recommendations were discussed                            with the patient. Sonny Masters "[name],  10/16/2021 1:55:52 PM

## 2021-10-16 NOTE — Patient Instructions (Signed)
Thank you for allowing [place] to care for you today. Await final polyp results, approximately 2 weeks.  Will make recommendation at that time. Resume previous diet and medications today.  Return to normal daily activities tomorrow, 10/17/21.   YOU HAD AN ENDOSCOPIC PROCEDURE TODAY AT [facility]:   Refer to the procedure report that was given to you for any specific questions about what was found during the examination.  If the procedure report does not answer your questions, please call your gastroenterologist to clarify.  If you requested that your care partner not be given the details of your procedure findings, then the procedure report has been included in a sealed envelope for you to review at your convenience later.  YOU SHOULD EXPECT: Some feelings of bloating in the abdomen. Passage of more gas than usual.  Walking can help get rid of the air that was put into your GI tract during the procedure and reduce the bloating. If you had a lower endoscopy (such as a colonoscopy or flexible sigmoidoscopy) you may notice spotting of blood in your stool or on the toilet paper. If you underwent a bowel prep for your procedure, you may not have a normal bowel movement for a few days.  Please Note:  You might notice some irritation and congestion in your nose or some drainage.  This is from the oxygen used during your procedure.  There is no need for concern and it should clear up in a day or so.  SYMPTOMS TO REPORT IMMEDIATELY:  Following lower endoscopy (colonoscopy or flexible sigmoidoscopy):  Excessive amounts of blood in the stool  Significant tenderness or worsening of abdominal pains  Swelling of the abdomen that is new, acute  Fever of 100F or higher   For urgent or emergent issues, a gastroenterologist can be reached at any hour by calling [phone]. Do not use MyChart messaging for urgent concerns.    DIET:  We do recommend a small meal at first, but then you may  proceed to your regular diet.  Drink plenty of fluids but you should avoid alcoholic beverages for 24 hours.  ACTIVITY:  You should plan to take it easy for the rest of today and you should NOT DRIVE or use heavy machinery until tomorrow (because of the sedation medicines used during the test).    FOLLOW UP: Our staff will call the number listed on your records 48-72 hours following your procedure to check on you and address any questions or concerns that you may have regarding the information given to you following your procedure. If we do not reach you, we will leave a message.  We will attempt to reach you two times.  During this call, we will ask if you have developed any symptoms of COVID 19. If you develop any symptoms (ie: fever, flu-like symptoms, shortness of breath, cough etc.) before then, please call [phone].  If you test positive for Covid 19 in the 2 weeks post procedure, please call and report this information to [place].    If any biopsies were taken you will be contacted by phone or by letter within the next 1-3 weeks.  Please call us at [phone] if you have not heard about the biopsies in 3 weeks.    SIGNATURES/CONFIDENTIALITY: You and/or your care partner have signed paperwork which will be entered into your electronic medical record.  These signatures attest to the fact that that the information above on your After Visit Summary has  been reviewed and is understood.  Full responsibility of the confidentiality of this discharge information lies with you and/or your care-partner.

## 2021-10-16 NOTE — Progress Notes (Signed)
PT taken to PACU. Monitors in place. VSS. Report given to RN. 

## 2021-10-16 NOTE — Progress Notes (Signed)
GASTROENTEROLOGY PROCEDURE H&P NOTE   Primary Care Physician: Ginger Organ., MD    Reason for Procedure:   Colon cancer screening  Plan:    Colonoscopy  Patient is appropriate for endoscopic procedure(s) in the ambulatory ([facility]) setting.  The nature of the procedure, as well as the risks, benefits, and alternatives were carefully and thoroughly reviewed with the patient. Ample time for discussion and questions allowed. The patient understood, was satisfied, and agreed to proceed.     HPI: Kelly Parsons is a 51 y.o. female who presents for colonoscopy for colon cancer screening. Denies blood in stools, changes in bowel habits, weight loss. Grandfather had colon cancer at age 56  Past Medical History:  Diagnosis Date   Family history of adverse reaction to anesthesia    mother- N/V    Fibroid    Hematuria    Hypothyroidism    on meds   Infertility, female    PCOS (polycystic ovarian syndrome)    Seasonal allergies     Past Surgical History:  Procedure Laterality Date   DILATATION & CURETTAGE/HYSTEROSCOPY WITH MYOSURE N/A 07/15/2020   Procedure: DILATATION & CURETTAGE/HYSTEROSCOPY WITH MYOSURE/POLYP RESECTION;  Surgeon: Megan Salon, MD;  Location: [facility];  Service: Gynecology;  Laterality: N/A;   LAPAROSCOPIC SALPINGO OOPHERECTOMY Right 07/15/2020   Procedure: LAPAROSCOPIC RIGHT SALPINGO OOPHORECTOMY, PELVIC WASHINGS;  Surgeon: Megan Salon, MD;  Location: [facility];  Service: Gynecology;  Laterality: Right;   WISDOM TOOTH EXTRACTION      Prior to Admission medications   Medication Sig Start Date End Date Taking? Authorizing Provider  Cholecalciferol (VITAMIN D3 PO) Take 1 tablet by mouth daily at 6 (six) AM.   Yes [provider]  NONFORMULARY OR COMPOUNDED ITEM SR progesterone 100mg .  1 capsule nightly. Patient taking differently: Take 1 capsule by mouth at bedtime. SR progesterone 100mg .  1 capsule nightly.  04/24/21  Yes Megan Salon, MD  Omega-3 Fatty Acids (FISH OIL) 1000 MG CAPS Take 1 capsule by mouth daily at 6 (six) AM.   Yes [provider]  Probiotic Product (PROBIOTIC-10 PO) Take 1 capsule by mouth daily.   Yes [provider]  thyroid (ARMOUR THYROID) 30 MG tablet Take 1 tablet (30 mg total) by mouth daily before breakfast. 08/30/16  Yes Lowne Chase, Yvonne R, DO  OVER THE COUNTER MEDICATION Take 300 mg by mouth at bedtime. Magnesium cisglycinate 275 mg daily at hs as needed for sleep    [provider]    Current Outpatient Medications  Medication Sig Dispense Refill   Cholecalciferol (VITAMIN D3 PO) Take 1 tablet by mouth daily at 6 (six) AM.     NONFORMULARY OR COMPOUNDED ITEM SR progesterone 100mg .  1 capsule nightly. (Patient taking differently: Take 1 capsule by mouth at bedtime. SR progesterone 100mg .  1 capsule nightly.) 90 each 4   Omega-3 Fatty Acids (FISH OIL) 1000 MG CAPS Take 1 capsule by mouth daily at 6 (six) AM.     Probiotic Product (PROBIOTIC-10 PO) Take 1 capsule by mouth daily.     thyroid (ARMOUR THYROID) 30 MG tablet Take 1 tablet (30 mg total) by mouth daily before breakfast. 30 tablet 1   OVER THE COUNTER MEDICATION Take 300 mg by mouth at bedtime. Magnesium cisglycinate 275 mg daily at hs as needed for sleep     Current Facility-Administered Medications  Medication Dose Route Frequency Provider Last Rate Last Admin   0.9 %  sodium chloride infusion  500 mL Intravenous Once Sharyn Creamer, MD        Allergies as of 10/16/2021   (No Known Allergies)    Family History  Problem Relation Age of Onset   Colon polyps Mother 17   Kidney disease Mother    Kidney cancer Mother 8       recurrent age 77   Breast cancer Mother 22   Colon polyps Father 99   Hypothyroidism Father    Diabetes Maternal Aunt    Diabetes Maternal Aunt    Diabetes Maternal Uncle    Diabetes Maternal Uncle    Diabetes Maternal Grandmother    Colon cancer  Maternal Grandfather    Alcohol abuse Paternal Grandmother    Heart attack Paternal Grandfather 27   Heart disease Paternal Grandfather 73       MI   Esophageal cancer Neg Hx    Rectal cancer Neg Hx    Stomach cancer Neg Hx     Social History   Socioeconomic History   Marital status: Married    Spouse name: Not on file   Number of children: 0   Years of education: Not on file   Highest education level: Not on file  Occupational History   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   Occupation: Oceanographer  Tobacco Use   Smoking status: Never   Smokeless tobacco: Never  Vaping Use   Vaping Use: Never used  Substance and Sexual Activity   Alcohol use: No   Drug use: No   Sexual activity: Yes    Partners: Male    Birth control/protection: None  Other Topics Concern   Not on file  Social History Narrative   Exercise-- none   Social Determinants of Health   Financial Resource Strain: Not on file  Food Insecurity: Not on file  Transportation Needs: Not on file  Physical Activity: Not on file  Stress: Not on file  Social Connections: Not on file  Intimate Partner Violence: Not on file    Physical Exam: Vital signs in last 24 hours: BP (!) 110/49    Pulse 63    Temp 98.2 F (36.8 C)    Ht 5\' 4"  (1.626 m)    Wt 145 lb (65.8 kg)    SpO2 100%    BMI 24.89 kg/m  GEN: NAD EYE: Sclerae anicteric ENT: MMM CV: Non-tachycardic Pulm: No increased work of breathing GI: Soft, NT/ND NEURO:  Alert & Oriented   [name], MD [facility]  10/16/2021 1:25 PM

## 2021-10-20 ENCOUNTER — Telehealth: Payer: Self-pay

## 2021-10-20 NOTE — Telephone Encounter (Signed)
°  Follow up Call-  Call back number 10/16/2021  Post procedure Call Back phone  # [phone]  Permission to leave phone message Yes  Some recent data might be hidden     Patient questions:  Do you have a fever, pain , or abdominal swelling? No. Pain Score  0 *  Have you tolerated food without any problems? Yes.    Have you been able to return to your normal activities? Yes.    Do you have any questions about your discharge instructions: Diet   No. Medications  No. Follow up visit  No.  Do you have questions or concerns about your Care? No.  Actions: * If pain score is 4 or above: No action needed, pain <4.   Have you developed a fever since your procedure? no  2.   Have you had an respiratory symptoms (SOB or cough) since your procedure? no  3.   Have you tested positive for COVID 19 since your procedure no  4.   Have you had any family members/close contacts diagnosed with the COVID 19 since your procedure?  no   If yes to any of these questions please route to Joylene John, RN and Joella Prince, RN

## 2021-10-21 ENCOUNTER — Encounter: Payer: Self-pay | Admitting: Internal Medicine

## 2022-03-29 ENCOUNTER — Ambulatory Visit (INDEPENDENT_AMBULATORY_CARE_PROVIDER_SITE_OTHER): Payer: 59 | Admitting: Obstetrics & Gynecology

## 2022-03-29 ENCOUNTER — Encounter (HOSPITAL_BASED_OUTPATIENT_CLINIC_OR_DEPARTMENT_OTHER): Payer: Self-pay | Admitting: Obstetrics & Gynecology

## 2022-03-29 VITALS — BP 98/62 | HR 64 | Ht 63.0 in | Wt 142.2 lb

## 2022-03-29 DIAGNOSIS — Z01419 Encounter for gynecological examination (general) (routine) without abnormal findings: Secondary | ICD-10-CM

## 2022-03-29 DIAGNOSIS — Z1231 Encounter for screening mammogram for malignant neoplasm of breast: Secondary | ICD-10-CM | POA: Diagnosis not present

## 2022-03-29 DIAGNOSIS — Z803 Family history of malignant neoplasm of breast: Secondary | ICD-10-CM | POA: Diagnosis not present

## 2022-03-29 DIAGNOSIS — N8501 Benign endometrial hyperplasia: Secondary | ICD-10-CM | POA: Diagnosis not present

## 2022-03-29 DIAGNOSIS — E282 Polycystic ovarian syndrome: Secondary | ICD-10-CM

## 2022-03-29 DIAGNOSIS — Z87448 Personal history of other diseases of urinary system: Secondary | ICD-10-CM

## 2022-03-29 LAB — POCT URINALYSIS DIPSTICK
Bilirubin, UA: NEGATIVE
Glucose, UA: NEGATIVE
Ketones, UA: NEGATIVE
Leukocytes, UA: NEGATIVE
Nitrite, UA: NEGATIVE
Protein, UA: NEGATIVE
Spec Grav, UA: 1.02 (ref 1.010–1.025)
Urobilinogen, UA: 0.2 E.U./dL
pH, UA: 5.5 (ref 5.0–8.0)

## 2022-03-29 MED ORDER — NONFORMULARY OR COMPOUNDED ITEM: 4 refills | Status: DC

## 2022-03-29 NOTE — Progress Notes (Signed)
51 y.o. G0P0000 Married Other or two or more races female here for annual exam.  Has regular menstrual cycles every 21 to 24 days.  H/o simple endometrial hyperplasia.  Was prescribed progesterone.  Not taking this regularly.  Willing to restart.  Recommended repeat endometrial biopsy.  Going on vacation tomorrow so will return for this.       Sexually active: Yes.    The current method of family planning is none.    Smoker:  no  Health Maintenance: Pap:  03/25/2021 Negative History of abnormal Pap:  no MMG:  12/25/2019 , f/u imaging was negative Colonoscopy:  10/16/2021, follow up 7 years, Dr. Lorenso Courier BMD:   not indicated Screening Labs: With Dr. Brigitte Pulse   reports that she has never smoked. She has never used smokeless tobacco. She reports that she does not drink alcohol and does not use drugs.  Past Medical History:  Diagnosis Date   Family history of adverse reaction to anesthesia    mother- N/V    Fibroid    Hematuria    Hypothyroidism    on meds   Infertility, female    PCOS (polycystic ovarian syndrome)    Seasonal allergies     Past Surgical History:  Procedure Laterality Date   DILATATION & CURETTAGE/HYSTEROSCOPY WITH MYOSURE N/A 07/15/2020   Procedure: DILATATION & CURETTAGE/HYSTEROSCOPY WITH MYOSURE/POLYP RESECTION;  Surgeon: Megan Salon, MD;  Location: [facility];  Service: Gynecology;  Laterality: N/A;   LAPAROSCOPIC SALPINGO OOPHERECTOMY Right 07/15/2020   Procedure: LAPAROSCOPIC RIGHT SALPINGO OOPHORECTOMY, PELVIC WASHINGS;  Surgeon: Megan Salon, MD;  Location: [facility];  Service: Gynecology;  Laterality: Right;   WISDOM TOOTH EXTRACTION      Current Outpatient Medications  Medication Sig Dispense Refill   Cholecalciferol (VITAMIN D3 PO) Take 1 tablet by mouth daily at 6 (six) AM.     loratadine (CLARITIN) 10 MG tablet Take 10 mg by mouth daily.     Probiotic Product (PROBIOTIC-10 PO) Take 1 capsule by mouth daily.      thyroid (ARMOUR THYROID) 30 MG tablet Take 1 tablet (30 mg total) by mouth daily before breakfast. 30 tablet 1   NONFORMULARY OR COMPOUNDED ITEM SR progesterone '100mg'$ .  1 capsule nightly. 90 each 4   No current facility-administered medications for this visit.    Family History  Problem Relation Age of Onset   Colon polyps Mother 31   Kidney disease Mother    Kidney cancer Mother 59       recurrent age 60   Breast cancer Mother 87   Colon polyps Father 22   Hypothyroidism Father    Diabetes Maternal Aunt    Diabetes Maternal Aunt    Diabetes Maternal Uncle    Diabetes Maternal Uncle    Diabetes Maternal Grandmother    Colon cancer Maternal Grandfather    Alcohol abuse Paternal Grandmother    Heart attack Paternal Grandfather 19   Heart disease Paternal Grandfather 19       MI   Esophageal cancer Neg Hx    Rectal cancer Neg Hx    Stomach cancer Neg Hx    ROS: Gyn: occasional LLQ pain  Exam:   BP 98/62 (BP Location: Right Arm, Patient Position: Sitting, Cuff Size: Normal)   Pulse 64   Ht '5\' 3"'$  (1.6 m) Comment: Reported  Wt 142 lb 3.2 oz (64.5 kg)   LMP 03/29/2022   BMI 25.19 kg/m   Height: '5\' 3"'$  (160 cm) (  Reported)  General appearance: alert, cooperative and appears stated age Head: Normocephalic, without obvious abnormality, atraumatic Neck: no adenopathy, supple, symmetrical, trachea midline and thyroid normal to inspection and palpation Lungs: clear to auscultation bilaterally Breasts: normal appearance, no masses or tenderness Heart: regular rate and rhythm Abdomen: soft, non-tender; bowel sounds normal; no masses,  no organomegaly Extremities: extremities normal, atraumatic, no cyanosis or edema Skin: Skin color, texture, turgor normal. No rashes or lesions Lymph nodes: Cervical, supraclavicular, and axillary nodes normal. No abnormal inguinal nodes palpated Neurologic: Grossly normal   Pelvic: External genitalia:  no lesions              Urethra:  normal  appearing urethra with no masses, tenderness or lesions              Bartholins and Skenes: normal                 Vagina: normal appearing vagina with normal color and no discharge, no lesions              Cervix: no lesions              Pap taken: No. Bimanual Exam:  Uterus:  enlarged, 10 weeks size              Adnexa: normal adnexa               Rectovaginal: Confirms               Anus:  normal sphincter tone, no lesions  Chaperone, Octaviano Batty, CMA, was present for exam.  Assessment/Plan: 1. Well woman exam with routine gynecological exam - pap neg with neg HR HPV 2022.  Not indicated today. - colonoscopy done last year - BMD not indicated - lab work done with Dr. Brigitte Pulse  2. Encounter for screening mammogram for malignant neoplasm of breast - MM 3D SCREEN BREAST BILATERAL; Future  3. Simple endometrial hyperplasia - pt will return for endometrial biopsy as not taking progesterone regularly - rx for micronized progesterone '100mg'$  nightly.  #90/3RF  4. Family history of breast cancer in mother - tyrer [place] model done with elevated lifetime risk.  Breast MRI discussed.  Declines today.  5. PCOS (polycystic ovarian syndrome)  6.  History of microscopic hematuria - has been followed by urology and had negative evaluation

## 2022-03-29 NOTE — Addendum Note (Signed)
Addended by: Octaviano Batty B on: 03/29/2022 01:58 PM   Modules accepted: Orders

## 2022-04-06 ENCOUNTER — Other Ambulatory Visit: Payer: Self-pay | Admitting: Internal Medicine

## 2022-04-06 DIAGNOSIS — E041 Nontoxic single thyroid nodule: Secondary | ICD-10-CM

## 2022-04-08 ENCOUNTER — Other Ambulatory Visit: Payer: 59

## 2022-04-08 ENCOUNTER — Ambulatory Visit
Admission: RE | Admit: 2022-04-08 | Discharge: 2022-04-08 | Disposition: A | Discharge status: Home / Self Care | Payer: 59 | Source: Ambulatory Visit | Attending: Internal Medicine | Admitting: Internal Medicine

## 2022-04-08 DIAGNOSIS — E041 Nontoxic single thyroid nodule: Secondary | ICD-10-CM

## 2022-09-30 ENCOUNTER — Ambulatory Visit (HOSPITAL_BASED_OUTPATIENT_CLINIC_OR_DEPARTMENT_OTHER)
Admission: RE | Admit: 2022-09-30 | Discharge: 2022-09-30 | Disposition: A | Discharge status: Home / Self Care | Payer: 59 | Source: Ambulatory Visit | Attending: Obstetrics & Gynecology | Admitting: Obstetrics & Gynecology

## 2022-09-30 ENCOUNTER — Encounter (HOSPITAL_BASED_OUTPATIENT_CLINIC_OR_DEPARTMENT_OTHER): Payer: Self-pay | Admitting: Obstetrics & Gynecology

## 2022-09-30 ENCOUNTER — Ambulatory Visit (INDEPENDENT_AMBULATORY_CARE_PROVIDER_SITE_OTHER): Payer: 59 | Admitting: Obstetrics & Gynecology

## 2022-09-30 VITALS — BP 112/61 | HR 66 | Ht 63.5 in | Wt 147.6 lb

## 2022-09-30 DIAGNOSIS — N8500 Endometrial hyperplasia, unspecified: Secondary | ICD-10-CM

## 2022-09-30 DIAGNOSIS — Z1231 Encounter for screening mammogram for malignant neoplasm of breast: Secondary | ICD-10-CM | POA: Diagnosis not present

## 2022-09-30 NOTE — Progress Notes (Signed)
GYNECOLOGY  VISIT  CC:  endometrial biopsy  HPI: 52 y.o. G0P0000 Married Other or two or more races female here for endometrial biopsy due to atypical endometrial hyperplasia.  LMP 09/15/2022.  She is taking the progesterone days 15-30 each month.  H/o long term infertility.  Has not had intercourse in several months.   Past Medical History:  Diagnosis Date   Family history of adverse reaction to anesthesia    mother- N/V    Fibroid    Hematuria    Hypothyroidism    on meds   Infertility, female    PCOS (polycystic ovarian syndrome)    Seasonal allergies     MEDS:   Current Outpatient Medications on File Prior to Visit  Medication Sig Dispense Refill   Cholecalciferol (VITAMIN D3 PO) Take 1 tablet by mouth daily at 6 (six) AM.     loratadine (CLARITIN) 10 MG tablet Take 10 mg by mouth daily.     NONFORMULARY OR COMPOUNDED ITEM SR progesterone '100mg'$ .  1 capsule nightly. 90 each 4   Probiotic Product (PROBIOTIC-10 PO) Take 1 capsule by mouth daily.     thyroid (ARMOUR THYROID) 30 MG tablet Take 1 tablet (30 mg total) by mouth daily before breakfast. 30 tablet 1   No current facility-administered medications on file prior to visit.    ALLERGIES: Patient has no known allergies.  SH:  married, non smoker  Review of Systems  Constitutional: Negative.   Genitourinary: Negative.     PHYSICAL EXAMINATION:    BP 112/61 (BP Location: Right Arm, Patient Position: Sitting, Cuff Size: Large)   Pulse 66   Ht 5' 3.5" (1.613 m) Comment: Reported  Wt 147 lb 9.6 oz (67 kg)   LMP 09/15/2022 (Approximate)   BMI 25.74 kg/m     General appearance: alert, cooperative and appears stated age  Lymph:  no inguinal LAD noted  Pelvic: External genitalia:  no lesions              Urethra:  normal appearing urethra with no masses, tenderness or lesions              Bartholins and Skenes: normal                 Vagina: normal appearing vagina with normal color and discharge, no lesions               Cervix: no lesions              Bimanual Exam:  Uterus:  normal size, contour, position, consistency, mobility, non-tender               Endometrial biopsy recommended.  Discussed with patient.  Verbal and written consent obtained.   Procedure:  Speculum placed.  Cervix visualized and cleansed with betadine prep.  A single toothed tenaculum was applied to the anterior lip of the cervix.  Endometrial pipelle was advanced through the cervix into the endometrial cavity without difficulty.  Pipelle passed to 8cm.  Suction applied and pipelle removed with good tissue sample obtained.  Tenculum removed.  No bleeding noted.  Patient tolerated procedure well.  Chaperone, Octaviano Batty, CMA, was present for exam.  Assessment/Plan: 1. Endometrial hyperplasia without atypia - Surgical pathology( [facility]/ [facility])

## 2022-10-01 LAB — SURGICAL PATHOLOGY

## 2022-10-04 ENCOUNTER — Other Ambulatory Visit: Payer: Self-pay | Admitting: Obstetrics & Gynecology

## 2022-10-04 DIAGNOSIS — R928 Other abnormal and inconclusive findings on diagnostic imaging of breast: Secondary | ICD-10-CM

## 2022-10-12 ENCOUNTER — Telehealth (HOSPITAL_BASED_OUTPATIENT_CLINIC_OR_DEPARTMENT_OTHER): Payer: Self-pay

## 2022-10-12 ENCOUNTER — Encounter (HOSPITAL_BASED_OUTPATIENT_CLINIC_OR_DEPARTMENT_OTHER): Payer: Self-pay | Admitting: Obstetrics & Gynecology

## 2022-10-12 ENCOUNTER — Other Ambulatory Visit (HOSPITAL_BASED_OUTPATIENT_CLINIC_OR_DEPARTMENT_OTHER): Payer: Self-pay | Admitting: Obstetrics & Gynecology

## 2022-10-12 DIAGNOSIS — R1032 Left lower quadrant pain: Secondary | ICD-10-CM

## 2022-10-12 NOTE — Telephone Encounter (Signed)
Patient called today and states that she was having a lot of pain in the left ovary area. She seems to think that it may be a cyst. She wants to know if you would like to set her up for an ultrasound? Please advise on what you want to do. tbw

## 2022-10-19 ENCOUNTER — Ambulatory Visit
Admission: RE | Admit: 2022-10-19 | Discharge: 2022-10-19 | Disposition: A | Discharge status: Home / Self Care | Payer: 59 | Source: Ambulatory Visit | Attending: Obstetrics & Gynecology | Admitting: Obstetrics & Gynecology

## 2022-10-19 DIAGNOSIS — R928 Other abnormal and inconclusive findings on diagnostic imaging of breast: Secondary | ICD-10-CM

## 2023-04-04 ENCOUNTER — Ambulatory Visit (INDEPENDENT_AMBULATORY_CARE_PROVIDER_SITE_OTHER): Payer: 59 | Admitting: Obstetrics & Gynecology

## 2023-04-04 ENCOUNTER — Encounter (HOSPITAL_BASED_OUTPATIENT_CLINIC_OR_DEPARTMENT_OTHER): Payer: Self-pay | Admitting: Obstetrics & Gynecology

## 2023-04-04 VITALS — BP 107/60 | HR 61 | Ht 64.0 in | Wt 146.6 lb

## 2023-04-04 DIAGNOSIS — R102 Pelvic and perineal pain: Secondary | ICD-10-CM

## 2023-04-04 DIAGNOSIS — Z124 Encounter for screening for malignant neoplasm of cervix: Secondary | ICD-10-CM

## 2023-04-04 DIAGNOSIS — D251 Intramural leiomyoma of uterus: Secondary | ICD-10-CM | POA: Diagnosis not present

## 2023-04-04 DIAGNOSIS — E041 Nontoxic single thyroid nodule: Secondary | ICD-10-CM

## 2023-04-04 DIAGNOSIS — E039 Hypothyroidism, unspecified: Secondary | ICD-10-CM

## 2023-04-04 LAB — POCT URINALYSIS DIPSTICK
Appearance: NORMAL
Bilirubin, UA: NEGATIVE
Glucose, UA: NEGATIVE
Ketones, UA: NEGATIVE
Leukocytes, UA: NEGATIVE
Nitrite, UA: NEGATIVE
Protein, UA: NEGATIVE
Spec Grav, UA: 1.015 (ref 1.010–1.025)
Urobilinogen, UA: 0.2 E.U./dL
pH, UA: 7 (ref 5.0–8.0)

## 2023-04-04 NOTE — Progress Notes (Deleted)
52 y.o. G0P0000 Married Other or two or more races female here for annual exam.    No LMP recorded. Patient has had an ablation.          Sexually active: {yes no:314532}  The current method of family planning is {contraception:315051}.     The pregnancy intention screening data noted above was reviewed. Potential methods of contraception were discussed. The patient elected to proceed with No data recorded.  Exercising: {yes no:314532}  {types:19826} Smoker:  no  Health Maintenance: Pap:  03/25/21 History of abnormal Pap:  {YES NO:22349} MMG:  09/30/22 Colonoscopy:  10/16/21 BMD:   *** Screening Labs: ***   reports that she has never smoked. She has never used smokeless tobacco. She reports that she does not drink alcohol and does not use drugs.  Past Medical History:  Diagnosis Date   Family history of adverse reaction to anesthesia    mother- N/V    Fibroid    Hematuria    Hypothyroidism    on meds   Infertility, female    PCOS (polycystic ovarian syndrome)    Seasonal allergies     Past Surgical History:  Procedure Laterality Date   DILATATION & CURETTAGE/HYSTEROSCOPY WITH MYOSURE N/A 07/15/2020   Procedure: DILATATION & CURETTAGE/HYSTEROSCOPY WITH MYOSURE/POLYP RESECTION;  Surgeon: Jerene Bears, MD;  Location: [facility] [place];  Service: Gynecology;  Laterality: N/A;   LAPAROSCOPIC SALPINGO OOPHERECTOMY Right 07/15/2020   Procedure: LAPAROSCOPIC RIGHT SALPINGO OOPHORECTOMY, PELVIC WASHINGS;  Surgeon: Jerene Bears, MD;  Location: [facility] [place];  Service: Gynecology;  Laterality: Right;   WISDOM TOOTH EXTRACTION      Current Outpatient Medications  Medication Sig Dispense Refill   Cholecalciferol (VITAMIN D3 PO) Take 1 tablet by mouth daily at 6 (six) AM.     fexofenadine (ALLEGRA) 60 MG tablet Take 60 mg by mouth daily.     NONFORMULARY OR COMPOUNDED ITEM SR progesterone 100mg .  1 capsule nightly. 90 each 4   Probiotic Product (PROBIOTIC-10 PO)  Take 1 capsule by mouth daily.     thyroid (ARMOUR THYROID) 30 MG tablet Take 1 tablet (30 mg total) by mouth daily before breakfast. (Patient taking differently: Take 60 mg by mouth daily before breakfast.) 30 tablet 1   No current facility-administered medications for this visit.    Family History  Problem Relation Age of Onset   Colon polyps Mother 55   Kidney disease Mother    Kidney cancer Mother 54       recurrent age 32   Breast cancer Mother 74   Colon polyps Father 43   Hypothyroidism Father    Diabetes Maternal Aunt    Diabetes Maternal Aunt    Diabetes Maternal Uncle    Diabetes Maternal Uncle    Diabetes Maternal Grandmother    Colon cancer Maternal Grandfather    Alcohol abuse Paternal Grandmother    Heart attack Paternal Grandfather 39   Heart disease Paternal Grandfather 28       MI   Esophageal cancer Neg Hx    Rectal cancer Neg Hx    Stomach cancer Neg Hx     ROS: Constitutional: {Findings; ROS constitutional:30497::"negative"} Genitourinary:{Findings; ROS genitourinary:19593::"negative"}  Exam:   BP 107/60   Pulse 61   Ht 5\' 4"  (1.626 m)   Wt 146 lb 9.6 oz (66.5 kg)   BMI 25.16 kg/m   Height: 5\' 4"  (162.6 cm)  General appearance: alert, cooperative and appears stated age Head: Normocephalic, without obvious  abnormality, atraumatic Neck: no adenopathy, supple, symmetrical, trachea midline and thyroid {EXAM; THYROID:18604} Lungs: clear to auscultation bilaterally Breasts: {Exam; breast:13139::"normal appearance, no masses or tenderness"} Heart: regular rate and rhythm Abdomen: soft, non-tender; bowel sounds normal; no masses,  no organomegaly Extremities: extremities normal, atraumatic, no cyanosis or edema Skin: Skin color, texture, turgor normal. No rashes or lesions Lymph nodes: Cervical, supraclavicular, and axillary nodes normal. No abnormal inguinal nodes palpated Neurologic: Grossly normal   Pelvic: External genitalia:  no lesions               Urethra:  normal appearing urethra with no masses, tenderness or lesions              Bartholins and Skenes: normal                 Vagina: normal appearing vagina with normal color and no discharge, no lesions              Cervix: {exam; cervix:14595}              Pap taken: {yes no:314532} Bimanual Exam:  Uterus:  {exam; uterus:12215}              Adnexa: {exam; adnexa:12223}               Rectovaginal: Confirms               Anus:  normal sphincter tone, no lesions  Chaperone, Raechel Ache, RN, was present for exam.  Assessment/Plan:

## 2023-04-04 NOTE — Progress Notes (Signed)
GYNECOLOGY  VISIT  CC:   pelvic pressure  HPI: 52 y.o. G0P0000 Married Other or two or more races female here for complaint of intermittent pelvic pressure.  Reports she has also recently had some increased watery vaginal discharge.  This was accompanied by some left lower quadrant pain.  This was much milder than it has been in the past.  Denies dysuria.  Is concerned about this possibly being a UTI.  POCT testing of urine today is negative.  H/O microscopic hematuria.  This is a long term finding for her and she has seen    Past Medical History:  Diagnosis Date   Family history of adverse reaction to anesthesia    mother- N/V    Fibroid    Hematuria    Hypothyroidism    on meds   Infertility, female    PCOS (polycystic ovarian syndrome)    Seasonal allergies     MEDS:   Current Outpatient Medications on File Prior to Visit  Medication Sig Dispense Refill   Cholecalciferol (VITAMIN D3 PO) Take 1 tablet by mouth daily at 6 (six) AM.     fexofenadine (ALLEGRA) 60 MG tablet Take 60 mg by mouth daily.     NONFORMULARY OR COMPOUNDED ITEM SR progesterone 100mg .  1 capsule nightly. 90 each 4   Probiotic Product (PROBIOTIC-10 PO) Take 1 capsule by mouth daily.     thyroid (ARMOUR THYROID) 30 MG tablet Take 1 tablet (30 mg total) by mouth daily before breakfast. (Patient taking differently: Take 60 mg by mouth daily before breakfast.) 30 tablet 1   No current facility-administered medications on file prior to visit.    ALLERGIES: Patient has no known allergies.  SH:  married, non smoker  Review of Systems  Constitutional: Negative.   Genitourinary: Negative.     PHYSICAL EXAMINATION:    BP 107/60   Pulse 61   Ht 5\' 4"  (1.626 m)   Wt 146 lb 9.6 oz (66.5 kg)   BMI 25.16 kg/m     General appearance: alert, cooperative and appears stated age Abdomen: soft, non-tender; bowel sounds normal; no masses,  no organomegaly Lymph:  no inguinal LAD noted  Pelvic: External genitalia:   no lesions              Urethra:  normal appearing urethra with no masses, tenderness or lesions              Bartholins and Skenes: normal                 Vagina: normal appearing vagina with normal color and discharge, no lesions              Cervix: no lesions              Bimanual Exam:  Uterus:  enlarged, 8 weeks size, mobile, smaller on exam              Adnexa: no mass, fullness, tenderness              Rectovaginal: Yes.  .  Confirms.              Anus:  normal sphincter tone, no lesions   Assessment/Plan: 1. Intramural leiomyoma of uterus - pt will return for ultrasound to recheck fibroids  2. Pelvic pressure in female - POCT urinalysis dipstick  3. Acquired hypothyroidism - Ambulatory referral to Endocrinology  4. Thyroid nodule - Ambulatory referral to Endocrinology

## 2023-04-06 ENCOUNTER — Ambulatory Visit (INDEPENDENT_AMBULATORY_CARE_PROVIDER_SITE_OTHER): Payer: 59

## 2023-04-06 DIAGNOSIS — D259 Leiomyoma of uterus, unspecified: Secondary | ICD-10-CM

## 2023-04-06 DIAGNOSIS — Z124 Encounter for screening for malignant neoplasm of cervix: Secondary | ICD-10-CM

## 2023-09-14 ENCOUNTER — Other Ambulatory Visit (HOSPITAL_BASED_OUTPATIENT_CLINIC_OR_DEPARTMENT_OTHER): Payer: Self-pay | Admitting: Certified Nurse Midwife

## 2023-11-10 ENCOUNTER — Other Ambulatory Visit (HOSPITAL_BASED_OUTPATIENT_CLINIC_OR_DEPARTMENT_OTHER): Payer: Self-pay | Admitting: *Deleted

## 2023-11-10 MED ORDER — NONFORMULARY OR COMPOUNDED ITEM: 0 refills | Status: DC

## 2023-11-10 NOTE — Progress Notes (Signed)
Pt called to request refill on progesterone. Pt provided with appt for annual exam. Refill sent to pharmacy

## 2024-04-05 ENCOUNTER — Encounter (HOSPITAL_BASED_OUTPATIENT_CLINIC_OR_DEPARTMENT_OTHER): Payer: Self-pay | Admitting: Obstetrics & Gynecology

## 2024-04-05 ENCOUNTER — Other Ambulatory Visit (HOSPITAL_COMMUNITY)
Admission: RE | Admit: 2024-04-05 | Discharge: 2024-04-05 | Disposition: A | Discharge status: Home / Self Care | Source: Ambulatory Visit | Attending: Obstetrics & Gynecology | Admitting: Obstetrics & Gynecology

## 2024-04-05 ENCOUNTER — Ambulatory Visit (HOSPITAL_BASED_OUTPATIENT_CLINIC_OR_DEPARTMENT_OTHER): Payer: 59 | Admitting: Obstetrics & Gynecology

## 2024-04-05 ENCOUNTER — Other Ambulatory Visit: Payer: Self-pay | Admitting: Obstetrics & Gynecology

## 2024-04-05 VITALS — BP 96/52 | HR 70 | Wt 147.0 lb

## 2024-04-05 DIAGNOSIS — N951 Menopausal and female climacteric states: Secondary | ICD-10-CM

## 2024-04-05 DIAGNOSIS — Z01419 Encounter for gynecological examination (general) (routine) without abnormal findings: Secondary | ICD-10-CM | POA: Diagnosis not present

## 2024-04-05 DIAGNOSIS — Z124 Encounter for screening for malignant neoplasm of cervix: Secondary | ICD-10-CM

## 2024-04-05 DIAGNOSIS — D251 Intramural leiomyoma of uterus: Secondary | ICD-10-CM | POA: Diagnosis not present

## 2024-04-05 DIAGNOSIS — Z803 Family history of malignant neoplasm of breast: Secondary | ICD-10-CM | POA: Diagnosis not present

## 2024-04-05 DIAGNOSIS — Z1231 Encounter for screening mammogram for malignant neoplasm of breast: Secondary | ICD-10-CM

## 2024-04-05 DIAGNOSIS — E282 Polycystic ovarian syndrome: Secondary | ICD-10-CM

## 2024-04-05 MED ORDER — NONFORMULARY OR COMPOUNDED ITEM: 3 refills | Status: AC

## 2024-04-05 NOTE — Progress Notes (Signed)
 ANNUAL EXAM Patient name: Kelly Parsons MRN [id number]  Date of birth: [birth year] Chief Complaint:   AEX  History of Present Illness:   Kelly Parsons is a 53 y.o. G0P0000 Caucasian female being seen today for a routine annual exam.  Doing well.  Having 5 day cycles about every 21 days.  Taking progesterone  nightly but missing some dosage.     Last pap 03/25/2021. Results were: NILM w/ HRHPV negative. H/O abnormal pap: no Last mammogram: 09/30/2022. Results were: normal. Family h/o breast cancer: yes mother Last colonoscopy: 10/16/2021. Results were: abnormal :  adenomas. F/u 7 years.       04/04/2023   10:47 AM 09/30/2022    9:42 AM 03/29/2022   11:41 AM 04/15/2021    9:07 AM 03/25/2021    2:27 PM  Depression screen PHQ 2/9  Decreased Interest 0 0 0 0 0  Down, Depressed, Hopeless 0 0 0 0 0  PHQ - 2 Score 0 0 0 0 0    Review of Systems:   Pertinent items are noted in HPI  Denies any bladder or bowels changes, pelvic pain.  Pertinent History Reviewed:  Reviewed past medical,surgical, social and family history.  Reviewed problem list, medications and allergies. Physical Assessment:   Vitals:   04/05/24 0949  BP: (!) 96/52  Pulse: 70  SpO2: 100%  Weight: 147 lb (66.7 kg)  Body mass index is 25.23 kg/m.        Physical Examination:   General appearance - well appearing, and in no distress  Mental status - alert, oriented to person, place, and time  Psych:  She has a normal mood and affect  Skin - warm and dry, normal color, no suspicious lesions noted  Chest - effort normal, all lung fields clear to auscultation bilaterally  Heart - normal rate and regular rhythm  Neck:  midline trachea, no thyromegaly or nodules  Breasts - breasts appear normal, no suspicious masses, no skin or nipple changes or  axillary nodes  Abdomen - soft, nontender, nondistended, no masses or organomegaly  Pelvic - VULVA: normal appearing vulva with no masses, tenderness or lesions   VAGINA: normal  appearing vagina with normal color and discharge, no lesions   CERVIX: normal appearing cervix without discharge or lesions, no CMT  Thin prep pap is done with HR HPV cotesting  UTERUS: retroverted, about 10 - 12 weeks, globular and mobile  ADNEXA: No adnexal masses or tenderness noted.  Rectal - normal rectal, good sphincter tone, no masses felt.   Extremities:  No swelling or varicosities noted  Chaperone present for exam  Assessment & Plan:  1. Well woman exam with routine gynecological exam (Primary) - Pap smear updated today - Mammogram scheduled later this month - Colonoscopy 2023 - lab work done with PCP, Dr. Loreli - vaccines reviewed/updated  2. Cervical cancer screening - Cytology - PAP( [place])  3. PCOS (polycystic ovarian syndrome)  4. Family history of breast cancer in mother - discussed breast MRI again this year.  She is going to consider this next year.  Reminder placed.   5. Fibroids, intramural - stable exam  6. Perimenopausal - NONFORMULARY OR COMPOUNDED ITEM; SR progesterone  100mg .  1 capsule nightly.  Dispense: 90 each; Refill: 3     Meds:  Meds ordered this encounter  Medications   NONFORMULARY OR COMPOUNDED ITEM    Sig: SR progesterone  100mg .  1 capsule nightly.    Dispense:  90 each    Refill:  3    Follow-up: Return in about 1 year (around 04/05/2025).  Ronal GORMAN Pinal, MD 04/05/2024 10:34 AM

## 2024-04-11 ENCOUNTER — Ambulatory Visit (HOSPITAL_BASED_OUTPATIENT_CLINIC_OR_DEPARTMENT_OTHER): Payer: Self-pay | Admitting: Obstetrics & Gynecology

## 2024-04-11 LAB — CYTOLOGY - PAP
Comment: NEGATIVE
Diagnosis: NEGATIVE
High risk HPV: NEGATIVE

## 2024-04-26 ENCOUNTER — Ambulatory Visit
Admission: RE | Admit: 2024-04-26 | Discharge: 2024-04-26 | Disposition: A | Discharge status: Home / Self Care | Source: Ambulatory Visit | Attending: Obstetrics & Gynecology | Admitting: Obstetrics & Gynecology

## 2024-04-26 DIAGNOSIS — Z1231 Encounter for screening mammogram for malignant neoplasm of breast: Secondary | ICD-10-CM

## 2024-09-05 ENCOUNTER — Encounter (HOSPITAL_BASED_OUTPATIENT_CLINIC_OR_DEPARTMENT_OTHER): Payer: Self-pay | Admitting: Obstetrics & Gynecology

## 2024-09-14 ENCOUNTER — Ambulatory Visit (INDEPENDENT_AMBULATORY_CARE_PROVIDER_SITE_OTHER): Payer: Self-pay | Admitting: Certified Nurse Midwife

## 2024-09-14 ENCOUNTER — Encounter (HOSPITAL_BASED_OUTPATIENT_CLINIC_OR_DEPARTMENT_OTHER): Payer: Self-pay | Admitting: Certified Nurse Midwife

## 2024-09-14 VITALS — BP 102/56 | HR 61 | Ht 64.0 in | Wt 145.8 lb

## 2024-09-14 DIAGNOSIS — N750 Cyst of Bartholin's gland: Secondary | ICD-10-CM | POA: Diagnosis not present

## 2024-09-14 NOTE — Progress Notes (Signed)
 Subjective:     Kelly Parsons is a 53 y.o. female who presents for evaluation of a cyst on the vulva. She traveled 2 weeks ago. After she returned home, she started to notice a cyst present to the left side of vaginal opening. Pt states it has started to reduce in size. She has not noted any drainage. Denies any current pain or tenderness. Denies fever or chills. No vaginal spotting or bleeding.     The following portions of the patient's history were reviewed and updated as appropriate: allergies, current medications, past family history, past medical history, past social history, past surgical history, and problem list.   Review of Systems Pertinent items are noted in HPI.    Objective:    BP (!) 102/56 (BP Location: Left Arm, Patient Position: Sitting, Cuff Size: Normal)   Pulse 61   Ht 5' 4 (1.626 m)   Wt 145 lb 12.8 oz (66.1 kg)   BMI 25.03 kg/m  General appearance: alert and cooperative Pelvic: Left Bartholin cyst noted. No erythema, tenderness or drainage. Approx size small grape.    Assessment:    Left Bartholin Gland Cyst.    Plan:    Pt will continue warm compresses/warm soaks/monitoring. She will call with fever, chills, cyst increasing in size and not resolving, new onset pain or tenderness/drainage. RTO July 2026 for annual gyn exam and prn if issues arise or if Bartholin Gland Cyst persists.  Arland MARLA Roller

## 2024-11-12 ENCOUNTER — Ambulatory Visit: Admitting: Gastroenterology
